# Patient Record
Sex: Female | Born: 1945
Health system: Southern US, Community
[De-identification: ages and names within clinical notes are randomized; demographics above are authoritative.]

## PROBLEM LIST (undated history)

## (undated) DIAGNOSIS — I10 Essential (primary) hypertension: Secondary | ICD-10-CM

## (undated) DIAGNOSIS — E079 Disorder of thyroid, unspecified: Secondary | ICD-10-CM

## (undated) DIAGNOSIS — E785 Hyperlipidemia, unspecified: Secondary | ICD-10-CM

## (undated) DIAGNOSIS — T7840XA Allergy, unspecified, initial encounter: Secondary | ICD-10-CM

## (undated) DIAGNOSIS — F32A Depression, unspecified: Secondary | ICD-10-CM

## (undated) DIAGNOSIS — F329 Major depressive disorder, single episode, unspecified: Secondary | ICD-10-CM

## (undated) DIAGNOSIS — M8580 Other specified disorders of bone density and structure, unspecified site: Secondary | ICD-10-CM

## (undated) DIAGNOSIS — F419 Anxiety disorder, unspecified: Secondary | ICD-10-CM

## (undated) HISTORY — DX: Essential (primary) hypertension: I10

## (undated) HISTORY — PX: ABDOMINAL HYSTERECTOMY: SHX81

## (undated) HISTORY — PX: TOTAL HIP ARTHROPLASTY: SHX124

## (undated) HISTORY — DX: Other specified disorders of bone density and structure, unspecified site: M85.80

## (undated) HISTORY — DX: Hyperlipidemia, unspecified: E78.5

## (undated) HISTORY — PX: THYROID SURGERY: SHX805

## (undated) HISTORY — DX: Depression, unspecified: F32.A

## (undated) HISTORY — DX: Anxiety disorder, unspecified: F41.9

## (undated) HISTORY — DX: Allergy, unspecified, initial encounter: T78.40XA

## (undated) HISTORY — DX: Major depressive disorder, single episode, unspecified: F32.9

## (undated) HISTORY — DX: Disorder of thyroid, unspecified: E07.9

---

## 1997-09-03 ENCOUNTER — Other Ambulatory Visit: Admission: RE | Admit: 1997-09-03 | Discharge: 1997-09-03 | Payer: Self-pay | Admitting: Obstetrics and Gynecology

## 1999-04-22 ENCOUNTER — Other Ambulatory Visit: Admission: RE | Admit: 1999-04-22 | Discharge: 1999-04-22 | Payer: Self-pay | Admitting: Obstetrics and Gynecology

## 2004-08-13 ENCOUNTER — Encounter: Admission: RE | Admit: 2004-08-13 | Discharge: 2004-08-13 | Payer: Self-pay | Admitting: Rheumatology

## 2005-01-26 ENCOUNTER — Inpatient Hospital Stay (HOSPITAL_COMMUNITY): Admission: RE | Admit: 2005-01-26 | Discharge: 2005-01-29 | Payer: Self-pay | Admitting: Orthopedic Surgery

## 2006-10-26 IMAGING — CR DG CHEST 2V
2 series · 2 of 2 positions shown · non-contrast
Comparison: none

CLINICAL DATA: Hypertension, preop

Chest 2 view:
No previous for comparison. Lungs are hyperinflated without focal infiltrate or
overt edema. Heart size and mediastinal contour normal. No effusion. Visualized
bones unremarkable.

[view not recorded (1 of 2)]
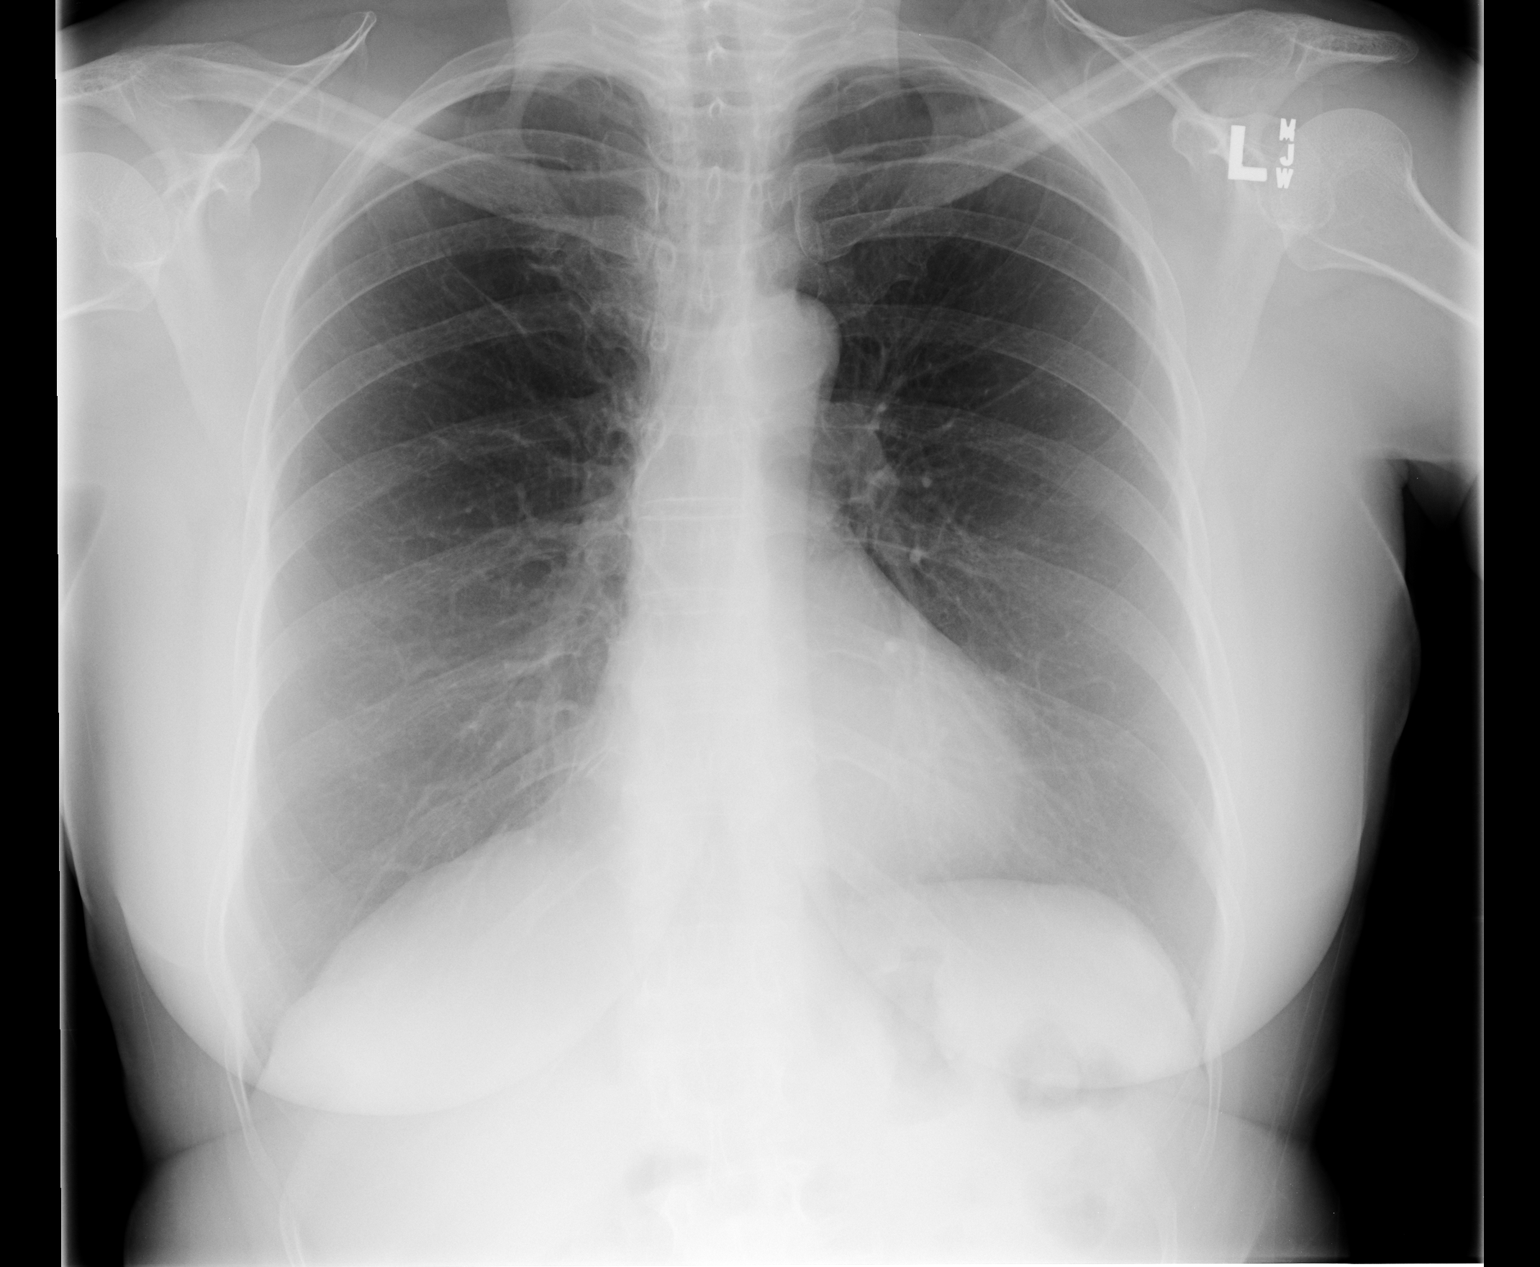

[view not recorded (2 of 2)]
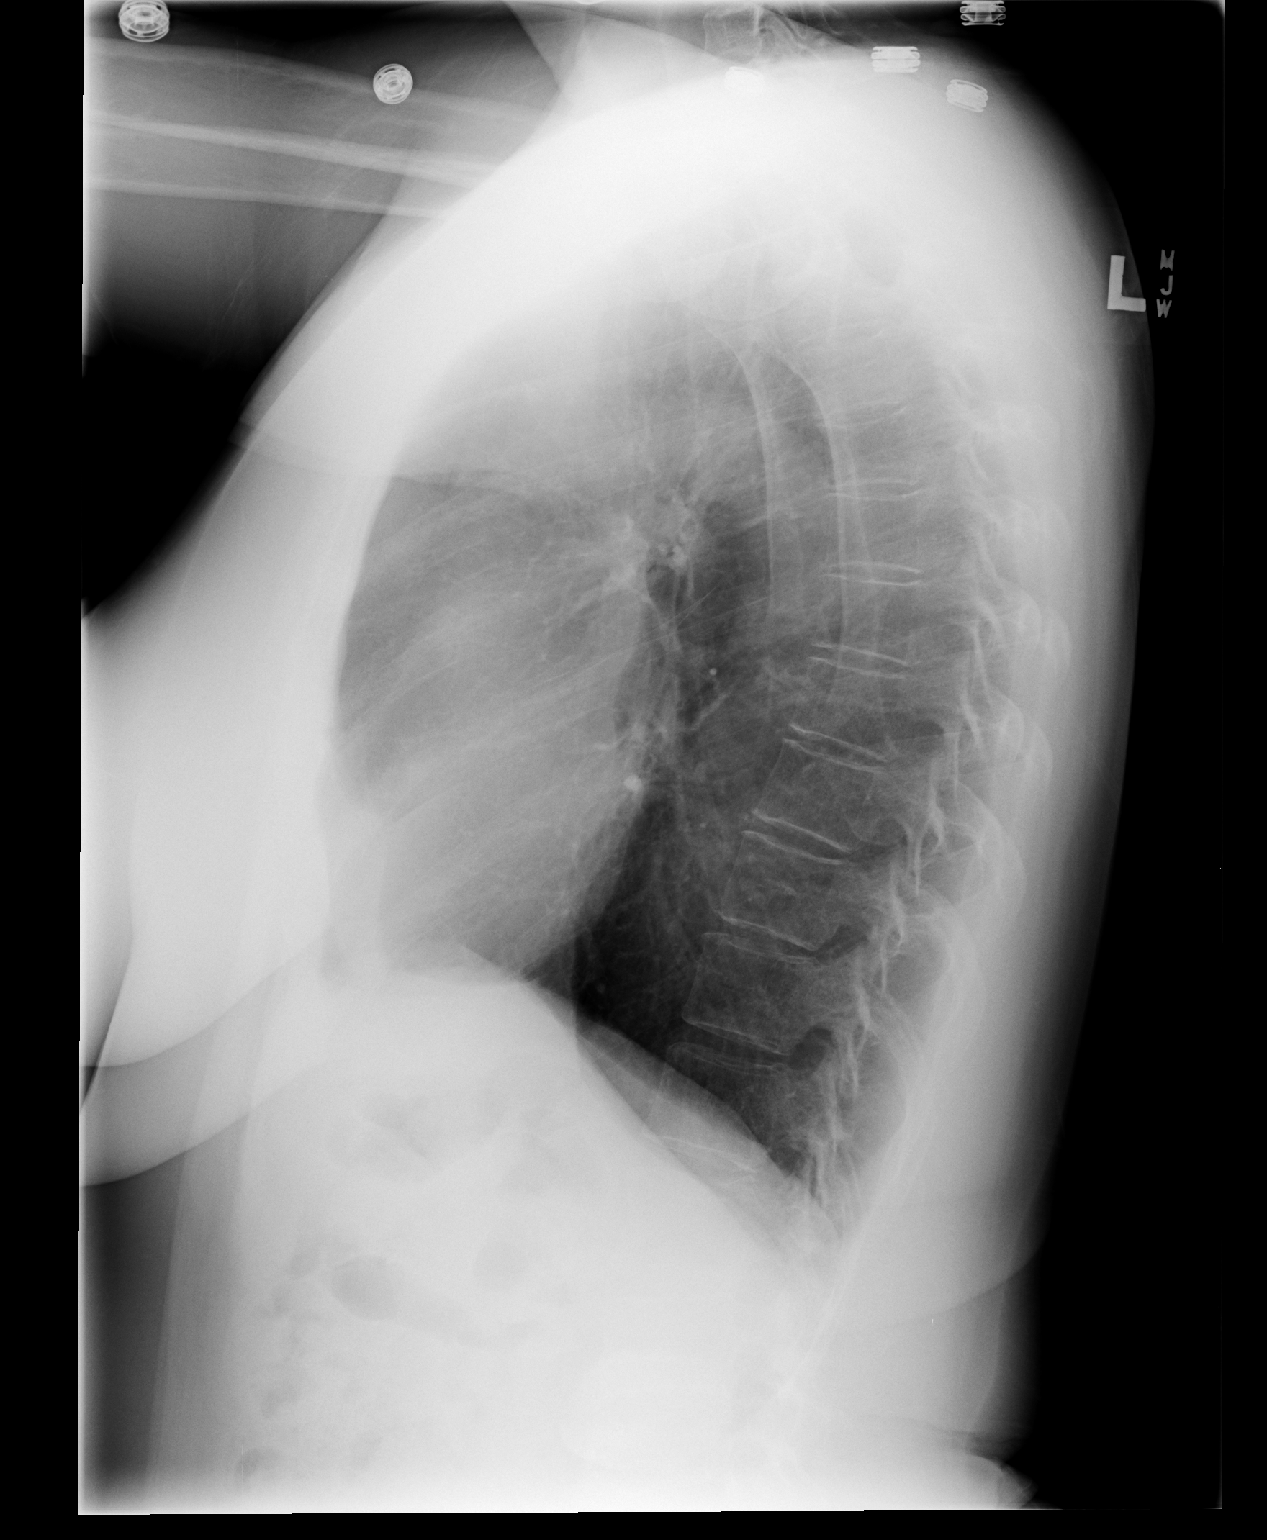

[2 of 2 positions shown; findings below may reference images not displayed]

IMPRESSION: 1. Hyperinflation without acute or superimposed abnormality

## 2007-01-21 ENCOUNTER — Ambulatory Visit: Payer: Self-pay | Admitting: Internal Medicine

## 2007-02-09 ENCOUNTER — Ambulatory Visit: Payer: Self-pay | Admitting: Internal Medicine

## 2010-08-01 NOTE — Discharge Summary (Signed)
Doyle, Casey NO.:  [id number]   MEDICAL RECORD NO.:  [id number]          PATIENT TYPE:  INP   LOCATION:  5017                         FACILITY:  [facility]   PHYSICIAN:  Mila Homer. Sherlean Foot, M.D. DATE OF BIRTH:  1945/11/04   DATE OF ADMISSION:  01/26/2005  DATE OF DISCHARGE:  01/29/2005                                 DISCHARGE SUMMARY   ADMISSION DIAGNOSES:  1.  Right hip osteoarthritis/avascular necrosis.  2.  Hypertension.  3.  Hypothyroidism with history of partial thyroidectomy.  4.  Hay fever.   DISCHARGE DIAGNOSES:  1.  Status post right total hip arthroplasty.  2.  Urinary tract infection preoperatively, treated with Bactrim.  3.  Acute blood loss anemia secondary to surgery requiring no blood      products.  4.  Hypokalemia, treated.  5.  Nausea and hypertension probably secondary to Percocet.  Patient placed      on Vicodin.  6.  History of hypertension.  7.  History of hypothyroidism with history of partial thyroidectomy.  8.  History of hay fever.   HISTORY OF PRESENT ILLNESS:  [name] is a 65 year old black female with a  two-year history of gradual onset of progressively worsening right hip pain.  The patient with a history of a fall in 1999 with a very questionable injury  to the hip but no other injuries or surgery.  Right hip pain is described as  a constant throbbing sensation during the day and just a tightening  sensation at night.  The pain seems to be in the right groin with some  tingling there and with occasional radiation down to the knee.  The pain  increases with activity and decreases with rest.  The patient denies any  back pain or paresthesia associated with the pain.  She does have difficulty  tying shoes.  She takes Aleve for pain, and this provides her with moderate  relief.  The patient denies any cortisone injections.  She does not ambulate  with any assistive devices.   ALLERGIES:  CORTISONE CAUSES RASH.   MEDICATIONS:  1.  Actonel 35 mg p.o. on Sunday.  2.  Synthroid 75 mcg one tablet q.a.m.  3.  Atenolol 25 mg one tablet p.o. q.a.m.  4.  Hydrochlorothiazide 25 mg one tablet p.o. q.a.m.  5.  Trazodone 50 mg p.o. q.h.s. p.r.n. insomnia.  6.  Aleve two tablets p.o. b.i.d. p.r.n.  7.  Calcium two tablets p.o. b.i.d.   PROCEDURE:  The patient was taken to the operating room on January 26, 2005  by Dr. Georgena Spurling, assisted by Richardean Canal, P.A.-C.  The patient was  placed under general anesthesia, and a right total hip arthroplasty was  performed.  The patient tolerated the procedure well and returned to  recovery in good and stable condition.   COMPONENTS USED:  52-mm outside diameter acetabular component with a 32-mm  inner diameter poly liner, a size 14 femoral stem, and a femoral head 32 mm  in diameter plus 1 mm neck length, using ____________ mm neck length.   HOSPITAL  COURSE:  On postoperative day #1, the patient had a T max of 100.1.  Otherwise, vital signs were stable.  H&H was 10.8 and 31.2.  White count was  5300.  UA on admission showed few bacteria, white blood cells 3-6,  leukocytes small.  Treated with Bactrim preoperatively.  The patient on  Ancef postoperatively, awaiting culture and sensitivity.  Otherwise, the  patient with no chest pain, shortness of breath, nausea, or vomiting.  Pain  under adequate control.   On postoperative day #2, the patient complaining of nausea and some  dizziness when first getting up, resolved.  No chest pain, no shortness of  breath.  The patient afebrile, vital signs stable.  H&H 9.3 and 27.4.  White  count 7400.  Potassium was 3.2 and was replaced.  Patient progressing well  with physical therapy and was able to ambulate 8 feet with a rolling walker.  Later on the morning of January 28, 2005, postoperative day #2, the patient  became hypotensive.  Heart rate was within normal limits.  Patient with  nausea.  Percocet was discontinued.   Vicodin was started for pain, and  Zofran was given for nausea.   On postoperative day #3, the patient overall felt much improved.  No chest  pain, no shortness of breath, nausea, vomiting, calf pain.  Pain under  adequate control.  T max 99.0.  Vital signs within normal limits.  H&H 8.6,  and 24.8.  White count was 6400.  Potassium was 3.5.  Urine culture still  pending at that time.   Patient requesting to go home despite some nausea, and medications were  called into [facility] on [address] for nausea post the patient being discharged.   LABORATORY DATA:  CBC dated January 22, 2005, all values within normal  limits.  Coags on admission, all values within normal limits.  Routine  chemistries on admission revealed a sodium of 138, potassium 3.9, chloride  100, bicarb 30, glucose 55 and low, BUN 14, creatinine 1.1, calcium 10.4.  Hepatic enzymes on admission, all values within normal limits.  Urinalysis  dated January 22, 2005 showed moderate leukocyte esterase, white blood cells  were 7-10.  Repeat urinalysis on January 27, 2005 revealed leukocyte  esterase small, bacteria few.  Urine culture and sensitivity on January 29, 2005 showed no growth.   X-rays of right hip dated January 26, 2005 showed the right hip to be  status post hip replacement without complicating features.  EKG dated  January 22, 2005 showed sinus bradycardia with a heart rate of 56 beats per  minute, PR interval 158 milliseconds, P-R-T axis of 6640 and 54.   DISCHARGE MEDICATIONS:  Copy of discharge instructions not available at time  of discharge summary; however, the patient was to continue:  1.  Actonel 35 mg on Sunday.  2.  Synthroid 75 mcg daily.  3.  Atenolol 25 mg daily.  Hold for systolic blood pressure less than 20.  4.  Hydrochlorothiazide 25 mg daily.  5.  Trazodone 50 mg q.h.s.  6.  Aleve p.r.n.  The patient is to hold this medication. 7.  Calcium two tablets two times a day.  8.  The patient was  placed on Lovenox for a total of 14 days.  As an      outpatient, the patient will get 40 mg of Lovenox daily.  This begins on      January 22, 2005.  AST was 23.  First dose would have been  on January 27, 2005, and last day of dose should be February 10, 2005.  9.  The patient was also on Bactrim DS at the time of discharge.  Unsure of      the duration of this.  10. Vicodin 5/500 one to two tablets p.o. q.4-6h. p.r.n. pain.   DIET:  No restrictions.   WOUND CARE:  The patient is to keep wound clean and dry and change dressing  daily.  May shower after two days of no drainage.  The patient is to call  the office for any signs of infection or if pain is not adequately  controlled.   FOLLOW UP:  The patient needs followup with Dr. Sherlean Foot in the office in  approximately 14 days from surgery.  The patient is to call the office at  [phone] for an appointment.   ACTIVITY:  The patient is weightbearing as tolerated on the right leg with a  walker.   DISPOSITION:  The patient was discharged home in good and stable condition.      Richardean Canal, P.A.    ______________________________  Mila Homer. Sherlean Foot, M.D.    GC/MEDQ  D:  04/13/2005  T:  04/14/2005  Job:  [id number]   cc:   Mila Homer. Sherlean Foot, M.D.  Fax: [phone]

## 2010-08-01 NOTE — H&P (Signed)
Casey, Doyle NO.:  [id number]   MEDICAL RECORD NO.:  [id number]          PATIENT TYPE:  INP   LOCATION:  5017                         FACILITY:  [facility]   PHYSICIAN:  Mila Homer. Sherlean Foot, M.D. DATE OF BIRTH:  1945/11/23   DATE OF ADMISSION:  01/26/2005  DATE OF DISCHARGE:                                HISTORY & PHYSICAL   CHIEF COMPLAINT:  Right hip pain for the last 2 years.   HISTORY OF PRESENT ILLNESS:  This 65 year old black female patient presented  to Dr. Sherlean Foot with a 2-year history of gradual onset progressively worsening  right hip pain. She does have a history of a fall in 1999 with a  questionable injury to the hip but she has had no other surgery or injury to  her hip.   At this point the right hip pain is a constant throbbing sensation during  the day and just a nagging sensation at night. Pain seems to be in the right  groin with some tingling there and then also over the right greater  trochanter with occasional radiation down to the knee. Pain increases with  activity and decreases with rest. She denies any back pain or paresthesias  associated with the pain. She does have difficulty tying her shoes and  occasional night pain. She is currently taking two Aleve for pain and that  provides a moderate amount of relief. She has not received any cortisone  injections and does not ambulate with any assistive devices.   ALLERGIES:  CORTISONE causes a rash.   CURRENT MEDICATIONS:  1.  Actonel 35 milligrams p.o. q. Sunday.  2.  Synthroid 75 mcg 1 tablet p.o. q.a.m.  3.  Atenolol 25 milligrams 1 tablet p.o. q.a.m.  4.  Hydrochlorothiazide 25 milligrams 1 tablet p.o. q.a.m.  5.  Trazodone 50 milligrams p.o. q.h.s. p.r.n. insomnia.  6.  Aleve 2 tablets p.o. b.i.d. p.r.n.  7.  Calcium 2 tablets p.o. b.i.d.   PAST MEDICAL HISTORY:  1.  Hypertension 20 years ago diagnosed.  2.  She had abnormal cells in her thyroid and a partial thyroidectomy in    1997.  3.  Hay fever.   She denies any history of diabetes mellitus, hiatal hernia, reflux, peptic  ulcer disease, heart disease, asthma or any other chronic medical condition  other than noted previously.   PAST SURGICAL HISTORY:  1.  Hysterectomy by Dr. Kizzie Bane 1982.  2.  Partial thyroidectomy 1997.   She denies any complications from the above-mentioned procedures.   SOCIAL HISTORY:  She was a social smoker, smoking two cigarettes a day for 2-  3 years but she quit 20 years ago. She does drink alcohol about twice a  week. She does not use any drugs. She is married and has two children, a boy  and girl. She lives with her husband and daughter in a two-story house with  four steps into the main entrance. She is retired from [employer]. Her medical doctor is Dr. Shaune Pollack at [place] at [phone].   FAMILY HISTORY:  Mother died at  age of 38 with rheumatoid arthritis and a  stroke. Father died at age of 18 with heart attack. She has one sister alive  at age 100 with osteoarthritis. Her daughter is 40, son 70 and they are alive  and well.   REVIEW OF SYSTEMS:  She does wear glasses. She has some problems with sinus  congestion in the fall and spring. She does not have a living will and her  power of attorney is her husband Casey Doyle. All other systems are  negative and noncontributory.   PHYSICAL EXAM:  GENERAL:  Well-developed, well-nourished black female in no  acute distress. Talks easily with examiner. Height 5 feet 3-1/4 inches,  weight 142 pounds. Mood and affect are appropriate. Talks easily with  examiner. BMI is 24.  VITAL SIGNS: Temperature 96.6 degrees Fahrenheit, pulse 68, respirations 12,  BP 135/88.  HEENT: Normocephalic, atraumatic without frontal or maxillary sinus  tenderness to palpation. Conjunctivae pink. Sclerae anicteric. PERLA. EOMs  intact. No visible external ear deformities. Hearing grossly intact.  Tympanic membranes pearly gray with  good light reflex. Nose: The nasal  septum midline. Nasal mucosa pink and moist without exudates or polyps  noted. Buccal mucosa pink and moist. Pharynx without erythema or exudates.  Tongue and uvula midline. Tongue without fasciculations and uvula rises  equally with phonation.  NECK: No visible masses or lesions noted. Trachea midline. No palpable  lymphadenopathy or thyromegaly. Carotids +2 bilaterally without bruits. Full  range of motion, nontender to palpation along the cervical spine.  CARDIOVASCULAR:  Heart rate and rhythm regular. S1-S2 present without rubs,  clicks or murmurs noted.  RESPIRATORY: Respirations even and unlabored. Breath sounds clear to  auscultation bilaterally without rales or wheezes noted.  ABDOMEN:  Rounded abdominal contour. Bowel sounds present x4 quadrants.  Soft, nontender to palpation without hepatosplenomegaly or CVA tenderness.  Femoral pulses +2 bilaterally. Nontender to palpation along the vertebral  column.  BREASTS:  GENITOURINARY:  RECTAL:  PELVIC: These exams deferred at this  time.  MUSCULOSKELETAL: No obvious deformities bilateral upper extremities with  full range of motion of these extremities without pain. Radial pulses +2  bilaterally. She has full range of motion of her ankles and toes  bilaterally. DP and PT pulses are +2. No lower extremity edema. No calf pain  with palpation. Negative Homans' sign.   Left hip has full extension and flexion to 110 degrees with full internal  and external rotation, all without pain. Left knee has full extension and  flexion to 130 degrees with some patellofemoral crepitus noted. No pain with  palpation along the joint line. Stable to varus and valgus stress. Negative  anterior drawer. No effusion.   Right hip has full extension but flexion only to 90 degrees and her leg  externally rotates as you bring it up into flexion. She has absolutely no internal-external rotation and when you attempt to do  that, that does cause  pain. No pain with palpation over the greater trochanter. Right knee has  full extension and flexion to 130 degrees without crepitus. No pain with  palpation along the joint line. No effusion. Stable to varus and valgus  stress. Negative anterior drawer.  NEUROLOGIC: Alert and oriented x3. Cranial nerves II-XII are grossly intact.  Strength 5/5 bilateral upper and lower extremities. Rapid alternating  movements intact. Deep tendon reflexes 2+.   RADIOLOGIC FINDINGS:  X-rays taken in May 2006 showed moderate arthritis,  moderate to severe joint space narrowing in the right  hip. There is a very,  osteophyte formation. Left hip is within normal limits.   IMPRESSION:  1.  Avascular necrosis/osteoarthritis, right hip.  2.  Hypertension.  3.  Hypothyroidism with history of partial thyroidectomy.  4.  Hay fever.   PLAN:  [name] will be admitted to [facility] on January 26, 2005 where she will undergo a right total hip arthroplasty by Dr. Mila Homer.  Lucey. She will undergo all the routine preoperative laboratory tests and  studies prior to this procedure. Dr. Kevan Ny has cleared her for surgery. If  we have any medical issues while she is hospitalized, we will consult the  Hospitalists.      Legrand Pitts Duffy, P.A.    ______________________________  Mila Homer. Sherlean Foot, M.D.    KED/MEDQ  D:  01/27/2005  T:  01/27/2005  Job:  [id number]

## 2010-08-01 NOTE — Op Note (Signed)
Casey Doyle, Casey Doyle                ACCOUNT NO.:  [id number]   MEDICAL RECORD NO.:  [id number]          PATIENT TYPE:  INP   LOCATION:  2550                         FACILITY:  [facility]   PHYSICIAN:  Mila Homer. Sherlean Foot, M.D. DATE OF BIRTH:  Nov 09, 1945   DATE OF PROCEDURE:  01/26/2005  DATE OF DISCHARGE:                                 OPERATIVE REPORT   PREOPERATIVE DIAGNOSIS:  Right hip osteoarthritis.   POSTOPERATIVE DIAGNOSIS:  Right hip osteoarthritis.   OPERATION PERFORMED:  Right total hip arthroplasty.   SURGEON:  Mila Homer. Sherlean Foot, M.D.   ASSISTANT:  None.   ANESTHESIA:  General.   INDICATIONS FOR PROCEDURE:  The patient is a 65 year old black female with  failure of conservative measures for avascular necrosis and osteoarthritis  of the right hip.  Informed consent was obtained and medical clearance was  obtained as well.   DESCRIPTION OF PROCEDURE:  The patient was laid supine and administered  general anesthesia and Foley catheter placed and placed in right up, left  down lateral decubitus position.  The hip was prepped and draped in the  usual sterile fashion.  A curvilinear incision was used and centered over  the trochanter, made with a #10 blade.  This was approximately 8 cm in  length.  I then used the cautery to cauterize bleeding vessels, went down to  and through the fascia.  I placed a Charnley retractor in place.  I then  elevated the anterior one third of the gluteus medius and vastus lateralis  in a single sleeve.  I then tagged these structures and then performed an  anterior hip capsulectomy.  Once this was performed, I then used the cutting  guide, marked out the neck cut and made that neck cut with a reciprocating  saw.  I then placed the leg off the sterile pouch with external rotation and  flexion at the knee.  I then placed a Homan anterior and posterior, cleaned  out the labrum and soft tissue in the hip and then reamed sequentially up to  a size 50 mm  reamer.  At this point I tamped in a 52 mm ho hole, no spike  cup.  I then turned my attention to the femur with the leg off into a  sterile pouch, I reamed up to 14, broached to 14, trialed with a 14 with a 0  head.  I had put in already the real standard polyethylene accepting a 32 mm  head.  This afforded excellent stability.  I then removed the trial  components, copiously irrigated and press-fitted the stem in the femur,  snapped on the 32 x +0 head on to a clean Morse taper, located the hip both  through extremes of range of motion and there was no instability.  I then  irrigated and closed the abductor vastus lateralis sleeve through drill  holes.  I then closed the fascia lata with a running #1 Vicryl suture,  closed the deep soft tissue with interrupted 0 Vicryl, subcuticular 2-0  Vicryl and skin staples.   COMPLICATIONS:  None.  DRAINS:  None.   ESTIMATED BLOOD LOSS:  300 mL.           ______________________________  Mila Homer. Sherlean Foot, M.D.     SDL/MEDQ  D:  01/26/2005  T:  01/27/2005  Job:  [id number]

## 2015-04-22 DIAGNOSIS — Z803 Family history of malignant neoplasm of breast: Secondary | ICD-10-CM | POA: Diagnosis not present

## 2015-04-22 DIAGNOSIS — Z1231 Encounter for screening mammogram for malignant neoplasm of breast: Secondary | ICD-10-CM | POA: Diagnosis not present

## 2015-04-23 DIAGNOSIS — J301 Allergic rhinitis due to pollen: Secondary | ICD-10-CM | POA: Diagnosis not present

## 2015-04-23 DIAGNOSIS — J3089 Other allergic rhinitis: Secondary | ICD-10-CM | POA: Diagnosis not present

## 2015-05-02 DIAGNOSIS — J3089 Other allergic rhinitis: Secondary | ICD-10-CM | POA: Diagnosis not present

## 2015-05-02 DIAGNOSIS — J301 Allergic rhinitis due to pollen: Secondary | ICD-10-CM | POA: Diagnosis not present

## 2015-05-10 DIAGNOSIS — J3089 Other allergic rhinitis: Secondary | ICD-10-CM | POA: Diagnosis not present

## 2015-05-10 DIAGNOSIS — J301 Allergic rhinitis due to pollen: Secondary | ICD-10-CM | POA: Diagnosis not present

## 2015-05-22 DIAGNOSIS — J3089 Other allergic rhinitis: Secondary | ICD-10-CM | POA: Diagnosis not present

## 2015-05-22 DIAGNOSIS — J301 Allergic rhinitis due to pollen: Secondary | ICD-10-CM | POA: Diagnosis not present

## 2015-05-31 DIAGNOSIS — J301 Allergic rhinitis due to pollen: Secondary | ICD-10-CM | POA: Diagnosis not present

## 2015-05-31 DIAGNOSIS — J3089 Other allergic rhinitis: Secondary | ICD-10-CM | POA: Diagnosis not present

## 2015-06-07 DIAGNOSIS — J3089 Other allergic rhinitis: Secondary | ICD-10-CM | POA: Diagnosis not present

## 2015-06-07 DIAGNOSIS — J301 Allergic rhinitis due to pollen: Secondary | ICD-10-CM | POA: Diagnosis not present

## 2015-06-17 DIAGNOSIS — J301 Allergic rhinitis due to pollen: Secondary | ICD-10-CM | POA: Diagnosis not present

## 2015-06-17 DIAGNOSIS — J3089 Other allergic rhinitis: Secondary | ICD-10-CM | POA: Diagnosis not present

## 2015-06-26 DIAGNOSIS — J3089 Other allergic rhinitis: Secondary | ICD-10-CM | POA: Diagnosis not present

## 2015-06-26 DIAGNOSIS — J301 Allergic rhinitis due to pollen: Secondary | ICD-10-CM | POA: Diagnosis not present

## 2015-07-10 DIAGNOSIS — J301 Allergic rhinitis due to pollen: Secondary | ICD-10-CM | POA: Diagnosis not present

## 2015-07-10 DIAGNOSIS — J3089 Other allergic rhinitis: Secondary | ICD-10-CM | POA: Diagnosis not present

## 2015-07-18 DIAGNOSIS — J301 Allergic rhinitis due to pollen: Secondary | ICD-10-CM | POA: Diagnosis not present

## 2015-07-18 DIAGNOSIS — J3089 Other allergic rhinitis: Secondary | ICD-10-CM | POA: Diagnosis not present

## 2015-07-31 DIAGNOSIS — J301 Allergic rhinitis due to pollen: Secondary | ICD-10-CM | POA: Diagnosis not present

## 2015-07-31 DIAGNOSIS — J3089 Other allergic rhinitis: Secondary | ICD-10-CM | POA: Diagnosis not present

## 2015-08-08 DIAGNOSIS — J3089 Other allergic rhinitis: Secondary | ICD-10-CM | POA: Diagnosis not present

## 2015-08-08 DIAGNOSIS — J301 Allergic rhinitis due to pollen: Secondary | ICD-10-CM | POA: Diagnosis not present

## 2015-08-15 DIAGNOSIS — J3089 Other allergic rhinitis: Secondary | ICD-10-CM | POA: Diagnosis not present

## 2015-08-15 DIAGNOSIS — G4700 Insomnia, unspecified: Secondary | ICD-10-CM | POA: Diagnosis not present

## 2015-08-15 DIAGNOSIS — E039 Hypothyroidism, unspecified: Secondary | ICD-10-CM | POA: Diagnosis not present

## 2015-08-15 DIAGNOSIS — I10 Essential (primary) hypertension: Secondary | ICD-10-CM | POA: Diagnosis not present

## 2015-08-15 DIAGNOSIS — E559 Vitamin D deficiency, unspecified: Secondary | ICD-10-CM | POA: Diagnosis not present

## 2015-08-15 DIAGNOSIS — J301 Allergic rhinitis due to pollen: Secondary | ICD-10-CM | POA: Diagnosis not present

## 2015-08-15 DIAGNOSIS — E7800 Pure hypercholesterolemia, unspecified: Secondary | ICD-10-CM | POA: Diagnosis not present

## 2015-08-23 DIAGNOSIS — J301 Allergic rhinitis due to pollen: Secondary | ICD-10-CM | POA: Diagnosis not present

## 2015-08-23 DIAGNOSIS — J3089 Other allergic rhinitis: Secondary | ICD-10-CM | POA: Diagnosis not present

## 2015-08-27 DIAGNOSIS — J301 Allergic rhinitis due to pollen: Secondary | ICD-10-CM | POA: Diagnosis not present

## 2015-08-27 DIAGNOSIS — J3089 Other allergic rhinitis: Secondary | ICD-10-CM | POA: Diagnosis not present

## 2015-09-10 DIAGNOSIS — J301 Allergic rhinitis due to pollen: Secondary | ICD-10-CM | POA: Diagnosis not present

## 2015-09-10 DIAGNOSIS — J3089 Other allergic rhinitis: Secondary | ICD-10-CM | POA: Diagnosis not present

## 2015-09-19 DIAGNOSIS — J301 Allergic rhinitis due to pollen: Secondary | ICD-10-CM | POA: Diagnosis not present

## 2015-09-19 DIAGNOSIS — J3089 Other allergic rhinitis: Secondary | ICD-10-CM | POA: Diagnosis not present

## 2015-09-27 DIAGNOSIS — J3089 Other allergic rhinitis: Secondary | ICD-10-CM | POA: Diagnosis not present

## 2015-09-27 DIAGNOSIS — J301 Allergic rhinitis due to pollen: Secondary | ICD-10-CM | POA: Diagnosis not present

## 2015-10-11 DIAGNOSIS — J301 Allergic rhinitis due to pollen: Secondary | ICD-10-CM | POA: Diagnosis not present

## 2015-10-11 DIAGNOSIS — J3089 Other allergic rhinitis: Secondary | ICD-10-CM | POA: Diagnosis not present

## 2015-10-25 DIAGNOSIS — J3089 Other allergic rhinitis: Secondary | ICD-10-CM | POA: Diagnosis not present

## 2015-10-25 DIAGNOSIS — J301 Allergic rhinitis due to pollen: Secondary | ICD-10-CM | POA: Diagnosis not present

## 2015-10-30 DIAGNOSIS — J301 Allergic rhinitis due to pollen: Secondary | ICD-10-CM | POA: Diagnosis not present

## 2015-10-30 DIAGNOSIS — J3089 Other allergic rhinitis: Secondary | ICD-10-CM | POA: Diagnosis not present

## 2015-11-08 DIAGNOSIS — J3089 Other allergic rhinitis: Secondary | ICD-10-CM | POA: Diagnosis not present

## 2015-11-08 DIAGNOSIS — J301 Allergic rhinitis due to pollen: Secondary | ICD-10-CM | POA: Diagnosis not present

## 2015-11-15 DIAGNOSIS — J3089 Other allergic rhinitis: Secondary | ICD-10-CM | POA: Diagnosis not present

## 2015-11-15 DIAGNOSIS — J301 Allergic rhinitis due to pollen: Secondary | ICD-10-CM | POA: Diagnosis not present

## 2015-11-20 DIAGNOSIS — J3089 Other allergic rhinitis: Secondary | ICD-10-CM | POA: Diagnosis not present

## 2015-11-20 DIAGNOSIS — J301 Allergic rhinitis due to pollen: Secondary | ICD-10-CM | POA: Diagnosis not present

## 2015-11-29 DIAGNOSIS — J301 Allergic rhinitis due to pollen: Secondary | ICD-10-CM | POA: Diagnosis not present

## 2015-11-29 DIAGNOSIS — J3089 Other allergic rhinitis: Secondary | ICD-10-CM | POA: Diagnosis not present

## 2015-12-04 DIAGNOSIS — J301 Allergic rhinitis due to pollen: Secondary | ICD-10-CM | POA: Diagnosis not present

## 2015-12-04 DIAGNOSIS — J3089 Other allergic rhinitis: Secondary | ICD-10-CM | POA: Diagnosis not present

## 2015-12-09 DIAGNOSIS — J301 Allergic rhinitis due to pollen: Secondary | ICD-10-CM | POA: Diagnosis not present

## 2015-12-09 DIAGNOSIS — J3089 Other allergic rhinitis: Secondary | ICD-10-CM | POA: Diagnosis not present

## 2015-12-19 DIAGNOSIS — J3089 Other allergic rhinitis: Secondary | ICD-10-CM | POA: Diagnosis not present

## 2015-12-19 DIAGNOSIS — J301 Allergic rhinitis due to pollen: Secondary | ICD-10-CM | POA: Diagnosis not present

## 2015-12-27 DIAGNOSIS — J301 Allergic rhinitis due to pollen: Secondary | ICD-10-CM | POA: Diagnosis not present

## 2015-12-27 DIAGNOSIS — J3089 Other allergic rhinitis: Secondary | ICD-10-CM | POA: Diagnosis not present

## 2016-01-10 DIAGNOSIS — J3089 Other allergic rhinitis: Secondary | ICD-10-CM | POA: Diagnosis not present

## 2016-01-10 DIAGNOSIS — J301 Allergic rhinitis due to pollen: Secondary | ICD-10-CM | POA: Diagnosis not present

## 2016-01-17 DIAGNOSIS — J3089 Other allergic rhinitis: Secondary | ICD-10-CM | POA: Diagnosis not present

## 2016-01-17 DIAGNOSIS — J301 Allergic rhinitis due to pollen: Secondary | ICD-10-CM | POA: Diagnosis not present

## 2016-01-23 DIAGNOSIS — J301 Allergic rhinitis due to pollen: Secondary | ICD-10-CM | POA: Diagnosis not present

## 2016-01-23 DIAGNOSIS — J3089 Other allergic rhinitis: Secondary | ICD-10-CM | POA: Diagnosis not present

## 2016-01-31 DIAGNOSIS — J301 Allergic rhinitis due to pollen: Secondary | ICD-10-CM | POA: Diagnosis not present

## 2016-01-31 DIAGNOSIS — J3089 Other allergic rhinitis: Secondary | ICD-10-CM | POA: Diagnosis not present

## 2016-02-12 DIAGNOSIS — E039 Hypothyroidism, unspecified: Secondary | ICD-10-CM | POA: Diagnosis not present

## 2016-02-12 DIAGNOSIS — E559 Vitamin D deficiency, unspecified: Secondary | ICD-10-CM | POA: Diagnosis not present

## 2016-02-12 DIAGNOSIS — M8580 Other specified disorders of bone density and structure, unspecified site: Secondary | ICD-10-CM | POA: Diagnosis not present

## 2016-02-12 DIAGNOSIS — I10 Essential (primary) hypertension: Secondary | ICD-10-CM | POA: Diagnosis not present

## 2016-02-12 DIAGNOSIS — E7800 Pure hypercholesterolemia, unspecified: Secondary | ICD-10-CM | POA: Diagnosis not present

## 2016-02-12 DIAGNOSIS — Z0000 Encounter for general adult medical examination without abnormal findings: Secondary | ICD-10-CM | POA: Diagnosis not present

## 2016-02-12 DIAGNOSIS — F411 Generalized anxiety disorder: Secondary | ICD-10-CM | POA: Diagnosis not present

## 2016-02-20 DIAGNOSIS — J3089 Other allergic rhinitis: Secondary | ICD-10-CM | POA: Diagnosis not present

## 2016-02-20 DIAGNOSIS — J301 Allergic rhinitis due to pollen: Secondary | ICD-10-CM | POA: Diagnosis not present

## 2016-02-28 DIAGNOSIS — J3089 Other allergic rhinitis: Secondary | ICD-10-CM | POA: Diagnosis not present

## 2016-02-28 DIAGNOSIS — J301 Allergic rhinitis due to pollen: Secondary | ICD-10-CM | POA: Diagnosis not present

## 2016-03-05 DIAGNOSIS — J3089 Other allergic rhinitis: Secondary | ICD-10-CM | POA: Diagnosis not present

## 2016-03-05 DIAGNOSIS — J301 Allergic rhinitis due to pollen: Secondary | ICD-10-CM | POA: Diagnosis not present

## 2016-03-12 DIAGNOSIS — G4700 Insomnia, unspecified: Secondary | ICD-10-CM | POA: Diagnosis not present

## 2016-03-12 DIAGNOSIS — F411 Generalized anxiety disorder: Secondary | ICD-10-CM | POA: Diagnosis not present

## 2016-03-13 DIAGNOSIS — Z23 Encounter for immunization: Secondary | ICD-10-CM | POA: Diagnosis not present

## 2016-03-20 DIAGNOSIS — J301 Allergic rhinitis due to pollen: Secondary | ICD-10-CM | POA: Diagnosis not present

## 2016-03-20 DIAGNOSIS — J3089 Other allergic rhinitis: Secondary | ICD-10-CM | POA: Diagnosis not present

## 2016-03-27 DIAGNOSIS — J301 Allergic rhinitis due to pollen: Secondary | ICD-10-CM | POA: Diagnosis not present

## 2016-03-27 DIAGNOSIS — J3089 Other allergic rhinitis: Secondary | ICD-10-CM | POA: Diagnosis not present

## 2016-04-09 DIAGNOSIS — D485 Neoplasm of uncertain behavior of skin: Secondary | ICD-10-CM | POA: Diagnosis not present

## 2016-04-09 DIAGNOSIS — J301 Allergic rhinitis due to pollen: Secondary | ICD-10-CM | POA: Diagnosis not present

## 2016-04-09 DIAGNOSIS — J3089 Other allergic rhinitis: Secondary | ICD-10-CM | POA: Diagnosis not present

## 2016-04-17 DIAGNOSIS — J301 Allergic rhinitis due to pollen: Secondary | ICD-10-CM | POA: Diagnosis not present

## 2016-04-17 DIAGNOSIS — J3089 Other allergic rhinitis: Secondary | ICD-10-CM | POA: Diagnosis not present

## 2016-04-22 DIAGNOSIS — Z803 Family history of malignant neoplasm of breast: Secondary | ICD-10-CM | POA: Diagnosis not present

## 2016-04-22 DIAGNOSIS — Z1231 Encounter for screening mammogram for malignant neoplasm of breast: Secondary | ICD-10-CM | POA: Diagnosis not present

## 2016-04-22 DIAGNOSIS — M8589 Other specified disorders of bone density and structure, multiple sites: Secondary | ICD-10-CM | POA: Diagnosis not present

## 2016-04-29 DIAGNOSIS — F411 Generalized anxiety disorder: Secondary | ICD-10-CM | POA: Diagnosis not present

## 2016-05-07 DIAGNOSIS — J3089 Other allergic rhinitis: Secondary | ICD-10-CM | POA: Diagnosis not present

## 2016-05-07 DIAGNOSIS — J301 Allergic rhinitis due to pollen: Secondary | ICD-10-CM | POA: Diagnosis not present

## 2016-05-21 DIAGNOSIS — J301 Allergic rhinitis due to pollen: Secondary | ICD-10-CM | POA: Diagnosis not present

## 2016-05-21 DIAGNOSIS — J3089 Other allergic rhinitis: Secondary | ICD-10-CM | POA: Diagnosis not present

## 2016-05-29 DIAGNOSIS — J3089 Other allergic rhinitis: Secondary | ICD-10-CM | POA: Diagnosis not present

## 2016-05-29 DIAGNOSIS — J301 Allergic rhinitis due to pollen: Secondary | ICD-10-CM | POA: Diagnosis not present

## 2016-06-10 DIAGNOSIS — J301 Allergic rhinitis due to pollen: Secondary | ICD-10-CM | POA: Diagnosis not present

## 2016-06-10 DIAGNOSIS — J3089 Other allergic rhinitis: Secondary | ICD-10-CM | POA: Diagnosis not present

## 2016-07-02 DIAGNOSIS — J3089 Other allergic rhinitis: Secondary | ICD-10-CM | POA: Diagnosis not present

## 2016-07-02 DIAGNOSIS — J301 Allergic rhinitis due to pollen: Secondary | ICD-10-CM | POA: Diagnosis not present

## 2016-07-08 DIAGNOSIS — J301 Allergic rhinitis due to pollen: Secondary | ICD-10-CM | POA: Diagnosis not present

## 2016-07-08 DIAGNOSIS — J3089 Other allergic rhinitis: Secondary | ICD-10-CM | POA: Diagnosis not present

## 2016-07-16 DIAGNOSIS — J301 Allergic rhinitis due to pollen: Secondary | ICD-10-CM | POA: Diagnosis not present

## 2016-07-16 DIAGNOSIS — J3089 Other allergic rhinitis: Secondary | ICD-10-CM | POA: Diagnosis not present

## 2016-07-23 DIAGNOSIS — J3089 Other allergic rhinitis: Secondary | ICD-10-CM | POA: Diagnosis not present

## 2016-07-23 DIAGNOSIS — J301 Allergic rhinitis due to pollen: Secondary | ICD-10-CM | POA: Diagnosis not present

## 2016-07-30 DIAGNOSIS — J301 Allergic rhinitis due to pollen: Secondary | ICD-10-CM | POA: Diagnosis not present

## 2016-07-30 DIAGNOSIS — J3089 Other allergic rhinitis: Secondary | ICD-10-CM | POA: Diagnosis not present

## 2016-08-14 DIAGNOSIS — J301 Allergic rhinitis due to pollen: Secondary | ICD-10-CM | POA: Diagnosis not present

## 2016-08-14 DIAGNOSIS — J3089 Other allergic rhinitis: Secondary | ICD-10-CM | POA: Diagnosis not present

## 2016-08-19 DIAGNOSIS — J301 Allergic rhinitis due to pollen: Secondary | ICD-10-CM | POA: Diagnosis not present

## 2016-08-19 DIAGNOSIS — J3089 Other allergic rhinitis: Secondary | ICD-10-CM | POA: Diagnosis not present

## 2016-09-02 DIAGNOSIS — J301 Allergic rhinitis due to pollen: Secondary | ICD-10-CM | POA: Diagnosis not present

## 2016-09-02 DIAGNOSIS — J3089 Other allergic rhinitis: Secondary | ICD-10-CM | POA: Diagnosis not present

## 2016-09-09 DIAGNOSIS — E7800 Pure hypercholesterolemia, unspecified: Secondary | ICD-10-CM | POA: Diagnosis not present

## 2016-09-09 DIAGNOSIS — I10 Essential (primary) hypertension: Secondary | ICD-10-CM | POA: Diagnosis not present

## 2016-09-09 DIAGNOSIS — E039 Hypothyroidism, unspecified: Secondary | ICD-10-CM | POA: Diagnosis not present

## 2016-09-09 DIAGNOSIS — E559 Vitamin D deficiency, unspecified: Secondary | ICD-10-CM | POA: Diagnosis not present

## 2016-09-11 DIAGNOSIS — J3089 Other allergic rhinitis: Secondary | ICD-10-CM | POA: Diagnosis not present

## 2016-09-11 DIAGNOSIS — J301 Allergic rhinitis due to pollen: Secondary | ICD-10-CM | POA: Diagnosis not present

## 2016-09-18 DIAGNOSIS — J301 Allergic rhinitis due to pollen: Secondary | ICD-10-CM | POA: Diagnosis not present

## 2016-09-18 DIAGNOSIS — J3089 Other allergic rhinitis: Secondary | ICD-10-CM | POA: Diagnosis not present

## 2016-10-02 DIAGNOSIS — J3089 Other allergic rhinitis: Secondary | ICD-10-CM | POA: Diagnosis not present

## 2016-10-02 DIAGNOSIS — J301 Allergic rhinitis due to pollen: Secondary | ICD-10-CM | POA: Diagnosis not present

## 2016-10-09 DIAGNOSIS — J3089 Other allergic rhinitis: Secondary | ICD-10-CM | POA: Diagnosis not present

## 2016-10-09 DIAGNOSIS — J301 Allergic rhinitis due to pollen: Secondary | ICD-10-CM | POA: Diagnosis not present

## 2016-10-23 DIAGNOSIS — J3089 Other allergic rhinitis: Secondary | ICD-10-CM | POA: Diagnosis not present

## 2016-10-23 DIAGNOSIS — J301 Allergic rhinitis due to pollen: Secondary | ICD-10-CM | POA: Diagnosis not present

## 2016-10-26 DIAGNOSIS — J301 Allergic rhinitis due to pollen: Secondary | ICD-10-CM | POA: Diagnosis not present

## 2016-10-26 DIAGNOSIS — J3089 Other allergic rhinitis: Secondary | ICD-10-CM | POA: Diagnosis not present

## 2016-11-04 DIAGNOSIS — J301 Allergic rhinitis due to pollen: Secondary | ICD-10-CM | POA: Diagnosis not present

## 2016-11-04 DIAGNOSIS — J3089 Other allergic rhinitis: Secondary | ICD-10-CM | POA: Diagnosis not present

## 2016-11-23 DIAGNOSIS — J301 Allergic rhinitis due to pollen: Secondary | ICD-10-CM | POA: Diagnosis not present

## 2016-11-23 DIAGNOSIS — J3089 Other allergic rhinitis: Secondary | ICD-10-CM | POA: Diagnosis not present

## 2016-12-17 DIAGNOSIS — J301 Allergic rhinitis due to pollen: Secondary | ICD-10-CM | POA: Diagnosis not present

## 2016-12-17 DIAGNOSIS — J3089 Other allergic rhinitis: Secondary | ICD-10-CM | POA: Diagnosis not present

## 2017-01-06 DIAGNOSIS — J301 Allergic rhinitis due to pollen: Secondary | ICD-10-CM | POA: Diagnosis not present

## 2017-01-06 DIAGNOSIS — J3089 Other allergic rhinitis: Secondary | ICD-10-CM | POA: Diagnosis not present

## 2017-01-21 DIAGNOSIS — J301 Allergic rhinitis due to pollen: Secondary | ICD-10-CM | POA: Diagnosis not present

## 2017-01-21 DIAGNOSIS — J3089 Other allergic rhinitis: Secondary | ICD-10-CM | POA: Diagnosis not present

## 2017-01-23 DIAGNOSIS — Z23 Encounter for immunization: Secondary | ICD-10-CM | POA: Diagnosis not present

## 2017-01-29 DIAGNOSIS — J3089 Other allergic rhinitis: Secondary | ICD-10-CM | POA: Diagnosis not present

## 2017-01-29 DIAGNOSIS — J301 Allergic rhinitis due to pollen: Secondary | ICD-10-CM | POA: Diagnosis not present

## 2017-02-03 DIAGNOSIS — J3089 Other allergic rhinitis: Secondary | ICD-10-CM | POA: Diagnosis not present

## 2017-02-03 DIAGNOSIS — J301 Allergic rhinitis due to pollen: Secondary | ICD-10-CM | POA: Diagnosis not present

## 2017-02-12 DIAGNOSIS — J301 Allergic rhinitis due to pollen: Secondary | ICD-10-CM | POA: Diagnosis not present

## 2017-02-12 DIAGNOSIS — J3089 Other allergic rhinitis: Secondary | ICD-10-CM | POA: Diagnosis not present

## 2017-02-18 DIAGNOSIS — J3089 Other allergic rhinitis: Secondary | ICD-10-CM | POA: Diagnosis not present

## 2017-02-18 DIAGNOSIS — J301 Allergic rhinitis due to pollen: Secondary | ICD-10-CM | POA: Diagnosis not present

## 2017-02-25 DIAGNOSIS — E559 Vitamin D deficiency, unspecified: Secondary | ICD-10-CM | POA: Diagnosis not present

## 2017-02-25 DIAGNOSIS — I10 Essential (primary) hypertension: Secondary | ICD-10-CM | POA: Diagnosis not present

## 2017-02-25 DIAGNOSIS — Z1159 Encounter for screening for other viral diseases: Secondary | ICD-10-CM | POA: Diagnosis not present

## 2017-02-25 DIAGNOSIS — E039 Hypothyroidism, unspecified: Secondary | ICD-10-CM | POA: Diagnosis not present

## 2017-02-25 DIAGNOSIS — Z0000 Encounter for general adult medical examination without abnormal findings: Secondary | ICD-10-CM | POA: Diagnosis not present

## 2017-03-05 DIAGNOSIS — J3089 Other allergic rhinitis: Secondary | ICD-10-CM | POA: Diagnosis not present

## 2017-03-05 DIAGNOSIS — J301 Allergic rhinitis due to pollen: Secondary | ICD-10-CM | POA: Diagnosis not present

## 2017-03-19 ENCOUNTER — Encounter: Payer: Self-pay | Admitting: Internal Medicine

## 2017-03-24 DIAGNOSIS — J301 Allergic rhinitis due to pollen: Secondary | ICD-10-CM | POA: Diagnosis not present

## 2017-03-24 DIAGNOSIS — J3089 Other allergic rhinitis: Secondary | ICD-10-CM | POA: Diagnosis not present

## 2017-04-02 DIAGNOSIS — J301 Allergic rhinitis due to pollen: Secondary | ICD-10-CM | POA: Diagnosis not present

## 2017-04-02 DIAGNOSIS — J3089 Other allergic rhinitis: Secondary | ICD-10-CM | POA: Diagnosis not present

## 2017-04-09 DIAGNOSIS — J3089 Other allergic rhinitis: Secondary | ICD-10-CM | POA: Diagnosis not present

## 2017-04-09 DIAGNOSIS — J301 Allergic rhinitis due to pollen: Secondary | ICD-10-CM | POA: Diagnosis not present

## 2017-04-21 DIAGNOSIS — J301 Allergic rhinitis due to pollen: Secondary | ICD-10-CM | POA: Diagnosis not present

## 2017-04-21 DIAGNOSIS — J3089 Other allergic rhinitis: Secondary | ICD-10-CM | POA: Diagnosis not present

## 2017-04-30 DIAGNOSIS — J301 Allergic rhinitis due to pollen: Secondary | ICD-10-CM | POA: Diagnosis not present

## 2017-04-30 DIAGNOSIS — J3089 Other allergic rhinitis: Secondary | ICD-10-CM | POA: Diagnosis not present

## 2017-05-12 DIAGNOSIS — J3089 Other allergic rhinitis: Secondary | ICD-10-CM | POA: Diagnosis not present

## 2017-05-12 DIAGNOSIS — J301 Allergic rhinitis due to pollen: Secondary | ICD-10-CM | POA: Diagnosis not present

## 2017-05-28 DIAGNOSIS — J3089 Other allergic rhinitis: Secondary | ICD-10-CM | POA: Diagnosis not present

## 2017-05-28 DIAGNOSIS — J301 Allergic rhinitis due to pollen: Secondary | ICD-10-CM | POA: Diagnosis not present

## 2017-06-04 DIAGNOSIS — J301 Allergic rhinitis due to pollen: Secondary | ICD-10-CM | POA: Diagnosis not present

## 2017-06-04 DIAGNOSIS — J3089 Other allergic rhinitis: Secondary | ICD-10-CM | POA: Diagnosis not present

## 2017-07-01 DIAGNOSIS — J3089 Other allergic rhinitis: Secondary | ICD-10-CM | POA: Diagnosis not present

## 2017-07-01 DIAGNOSIS — J301 Allergic rhinitis due to pollen: Secondary | ICD-10-CM | POA: Diagnosis not present

## 2017-07-09 DIAGNOSIS — R040 Epistaxis: Secondary | ICD-10-CM | POA: Diagnosis not present

## 2017-07-09 DIAGNOSIS — J301 Allergic rhinitis due to pollen: Secondary | ICD-10-CM | POA: Diagnosis not present

## 2017-07-09 DIAGNOSIS — J3089 Other allergic rhinitis: Secondary | ICD-10-CM | POA: Diagnosis not present

## 2017-07-30 DIAGNOSIS — J3089 Other allergic rhinitis: Secondary | ICD-10-CM | POA: Diagnosis not present

## 2017-07-30 DIAGNOSIS — J301 Allergic rhinitis due to pollen: Secondary | ICD-10-CM | POA: Diagnosis not present

## 2017-08-27 DIAGNOSIS — J3089 Other allergic rhinitis: Secondary | ICD-10-CM | POA: Diagnosis not present

## 2017-08-27 DIAGNOSIS — J301 Allergic rhinitis due to pollen: Secondary | ICD-10-CM | POA: Diagnosis not present

## 2017-09-02 DIAGNOSIS — I10 Essential (primary) hypertension: Secondary | ICD-10-CM | POA: Diagnosis not present

## 2017-09-02 DIAGNOSIS — F411 Generalized anxiety disorder: Secondary | ICD-10-CM | POA: Diagnosis not present

## 2017-09-02 DIAGNOSIS — E559 Vitamin D deficiency, unspecified: Secondary | ICD-10-CM | POA: Diagnosis not present

## 2017-09-02 DIAGNOSIS — E7800 Pure hypercholesterolemia, unspecified: Secondary | ICD-10-CM | POA: Diagnosis not present

## 2017-10-01 DIAGNOSIS — J301 Allergic rhinitis due to pollen: Secondary | ICD-10-CM | POA: Diagnosis not present

## 2017-10-01 DIAGNOSIS — J3089 Other allergic rhinitis: Secondary | ICD-10-CM | POA: Diagnosis not present

## 2017-10-29 DIAGNOSIS — J301 Allergic rhinitis due to pollen: Secondary | ICD-10-CM | POA: Diagnosis not present

## 2017-10-29 DIAGNOSIS — J3089 Other allergic rhinitis: Secondary | ICD-10-CM | POA: Diagnosis not present

## 2017-11-04 DIAGNOSIS — J3089 Other allergic rhinitis: Secondary | ICD-10-CM | POA: Diagnosis not present

## 2017-11-04 DIAGNOSIS — J301 Allergic rhinitis due to pollen: Secondary | ICD-10-CM | POA: Diagnosis not present

## 2017-11-24 DIAGNOSIS — J301 Allergic rhinitis due to pollen: Secondary | ICD-10-CM | POA: Diagnosis not present

## 2017-11-24 DIAGNOSIS — J3089 Other allergic rhinitis: Secondary | ICD-10-CM | POA: Diagnosis not present

## 2017-12-30 ENCOUNTER — Encounter: Payer: Self-pay | Admitting: Gastroenterology

## 2018-01-27 ENCOUNTER — Ambulatory Visit (AMBULATORY_SURGERY_CENTER): Payer: Self-pay

## 2018-01-27 ENCOUNTER — Encounter: Payer: Self-pay | Admitting: Gastroenterology

## 2018-01-27 VITALS — Ht 62.0 in | Wt 135.4 lb

## 2018-01-27 DIAGNOSIS — Z1211 Encounter for screening for malignant neoplasm of colon: Secondary | ICD-10-CM

## 2018-01-27 NOTE — Progress Notes (Signed)
Denies allergies to eggs or soy products. Denies complication of anesthesia or sedation. Denies use of weight loss medication. Denies use of O2.   Emmi instructions declined.  

## 2018-02-03 ENCOUNTER — Encounter: Payer: Self-pay | Admitting: Gastroenterology

## 2018-02-03 ENCOUNTER — Ambulatory Visit (AMBULATORY_SURGERY_CENTER): Payer: Medicare Other | Admitting: Gastroenterology

## 2018-02-03 VITALS — BP 99/72 | HR 63 | Temp 96.9°F | Resp 12

## 2018-02-03 DIAGNOSIS — Z86010 Personal history of colonic polyps: Secondary | ICD-10-CM | POA: Diagnosis not present

## 2018-02-03 DIAGNOSIS — Z1211 Encounter for screening for malignant neoplasm of colon: Secondary | ICD-10-CM

## 2018-02-03 DIAGNOSIS — I10 Essential (primary) hypertension: Secondary | ICD-10-CM | POA: Diagnosis not present

## 2018-02-03 MED ORDER — SODIUM CHLORIDE 0.9 % IV SOLN: 500.0000 mL | Freq: Once | INTRAVENOUS | Status: DC

## 2018-02-03 NOTE — Progress Notes (Signed)
Report given to PACU, vss 

## 2018-02-03 NOTE — Patient Instructions (Signed)
YOU HAD AN ENDOSCOPIC PROCEDURE TODAY AT THE [facility]:   Refer to the procedure report that was given to you for any specific questions about what was found during the examination.  If the procedure report does not answer your questions, please call your gastroenterologist to clarify.  If you requested that your care partner not be given the details of your procedure findings, then the procedure report has been included in a sealed envelope for you to review at your convenience later.  YOU SHOULD EXPECT: Some feelings of bloating in the abdomen. Passage of more gas than usual.  Walking can help get rid of the air that was put into your GI tract during the procedure and reduce the bloating. If you had a lower endoscopy (such as a colonoscopy or flexible sigmoidoscopy) you may notice spotting of blood in your stool or on the toilet paper. If you underwent a bowel prep for your procedure, you may not have a normal bowel movement for a few days.  Please Note:  You might notice some irritation and congestion in your nose or some drainage.  This is from the oxygen used during your procedure.  There is no need for concern and it should clear up in a day or so.  SYMPTOMS TO REPORT IMMEDIATELY:   Following lower endoscopy (colonoscopy or flexible sigmoidoscopy):  Excessive amounts of blood in the stool  Significant tenderness or worsening of abdominal pains  Swelling of the abdomen that is new, acute  Fever of 100F or higher  For urgent or emergent issues, a gastroenterologist can be reached at any hour by calling [phone].   DIET:  We do recommend a small meal at first, but then you may proceed to your regular diet.  Drink plenty of fluids but you should avoid alcoholic beverages for 24 hours.  ACTIVITY:  You should plan to take it easy for the rest of today and you should NOT DRIVE or use heavy machinery until tomorrow (because of the sedation medicines used during the test).     FOLLOW UP: Our staff will call the number listed on your records the next business day following your procedure to check on you and address any questions or concerns that you may have regarding the information given to you following your procedure. If we do not reach you, we will leave a message.  However, if you are feeling well and you are not experiencing any problems, there is no need to return our call.  We will assume that you have returned to your regular daily activities without incident.  If any biopsies were taken you will be contacted by phone or by letter within the next 1-3 weeks.  Please call us at [phone] if you have not heard about the biopsies in 3 weeks.    SIGNATURES/CONFIDENTIALITY: You and/or your care partner have signed paperwork which will be entered into your electronic medical record.  These signatures attest to the fact that that the information above on your After Visit Summary has been reviewed and is understood.  Full responsibility of the confidentiality of this discharge information lies with you and/or your care-partner. 

## 2018-02-03 NOTE — Op Note (Addendum)
[facility] Patient Name: Casey Doyle Procedure Date: 02/03/2018 1:25 PM MRN: [id number] Endoscopist: Thornton Park MD, MD Age: 72 Referring MD:  Date of Birth: 07-30-1945 Gender: Female Account #: [id number] Procedure:                Colonoscopy Indications:              Screening for colorectal malignant neoplasm. Normal                            screening colonsocopy 2008 with Dr. Olevia Perches. No                            known family history of colon cancer or polyps. No                            baseline GI symptoms. Medicines:                See the Anesthesia note for documentation of the                            administered medications Procedure:                Pre-Anesthesia Assessment:                           - Prior to the procedure, a History and Physical                            was performed, and patient medications and                            allergies were reviewed. The patient's tolerance of                            previous anesthesia was also reviewed. The risks                            and benefits of the procedure and the sedation                            options and risks were discussed with the patient.                            All questions were answered, and informed consent                            was obtained. Prior Anticoagulants: The patient has                            taken no previous anticoagulant or antiplatelet                            agents. ASA Grade Assessment: II - A patient with  mild systemic disease. After reviewing the risks                            and benefits, the patient was deemed in                            satisfactory condition to undergo the procedure.                           After obtaining informed consent, the colonoscope                            was passed under direct vision. Throughout the                            procedure, the patient's blood  pressure, pulse, and                            oxygen saturations were monitored continuously. The                            Colonoscope was introduced through the anus and                            advanced to the the sigmoid colon. The colonoscopy                            was performed without difficulty. The patient                            tolerated the procedure well. The quality of the                            bowel preparation was inadequate. Scope In: 1:40:22 PM Scope Out: 1:45:41 PM Total Procedure Duration: 0 hours 5 minutes 19 seconds  Findings:                 The perianal and digital rectal examinations were                            normal.                           Many small and large-mouthed diverticula were found                            in the sigmoid colon.                           A large amount of liquid semi-liquid semi-solid                            solid stool was found in the entire colon,  precluding visualization. Complications:            No immediate complications. Estimated Blood Loss:     Estimated blood loss: none. Impression:               - Preparation of the colon was inadequate for any                            meaningful evaluation.                           - Extensive diverticulosis in the sigmoid colon.                           - Stool in the entire examined colon.                           - No specimens collected. Recommendation:           - Discharge patient to home.                           - Resume regular diet. High fiber diet recommended.                           - Continue present medications.                           - Repeat colonoscopy at the next available                            appointment because the bowel preparation was poor.                            Two day bowel prep recommended. Thornton Park MD, MD 02/03/2018 1:52:02 PM This report has been signed electronically.

## 2018-02-03 NOTE — Progress Notes (Signed)
Pt's states no medical or surgical changes since previsit or office visit. 

## 2018-02-04 ENCOUNTER — Telehealth: Payer: Self-pay

## 2018-02-04 NOTE — Telephone Encounter (Signed)
  Follow up Call-  Call back number 02/03/2018  Post procedure Call Back phone  # [phone]  Permission to leave phone message Yes  Some recent data might be hidden     Patient questions:  Do you have a fever, pain , or abdominal swelling? No. Pain Score  0 *  Have you tolerated food without any problems? Yes.    Have you been able to return to your normal activities? Yes.    Do you have any questions about your discharge instructions: Diet   No. Medications  No. Follow up visit  No.  Do you have questions or concerns about your Care? No.  Actions: * If pain score is 4 or above: No action needed, pain <4.  Correct pt. Contact no. Is [phone].

## 2018-02-16 ENCOUNTER — Other Ambulatory Visit: Payer: Self-pay

## 2018-02-16 ENCOUNTER — Ambulatory Visit (AMBULATORY_SURGERY_CENTER): Payer: Self-pay | Admitting: *Deleted

## 2018-02-16 VITALS — Ht 62.0 in | Wt 133.2 lb

## 2018-02-16 DIAGNOSIS — Z1211 Encounter for screening for malignant neoplasm of colon: Secondary | ICD-10-CM

## 2018-02-16 MED ORDER — SUPREP BOWEL PREP KIT 17.5-3.13-1.6 GM/177ML PO SOLN: 1.0000 | Freq: Once | ORAL | 0 refills | Status: AC

## 2018-02-16 NOTE — Progress Notes (Signed)
No egg or soy allergy known to patient  No issues with past sedation with any surgeries  or procedures, no intubation problems  No diet pills per patient No home 02 use per patient  No blood thinners per patient  Pt denies issues with constipation  No A fib or A flutter  EMMI video offered and declined by the patient. 

## 2018-03-14 DIAGNOSIS — I10 Essential (primary) hypertension: Secondary | ICD-10-CM | POA: Diagnosis not present

## 2018-03-14 DIAGNOSIS — Z0000 Encounter for general adult medical examination without abnormal findings: Secondary | ICD-10-CM | POA: Diagnosis not present

## 2018-03-14 DIAGNOSIS — E7800 Pure hypercholesterolemia, unspecified: Secondary | ICD-10-CM | POA: Diagnosis not present

## 2018-03-14 DIAGNOSIS — E039 Hypothyroidism, unspecified: Secondary | ICD-10-CM | POA: Diagnosis not present

## 2018-03-24 ENCOUNTER — Encounter: Payer: Medicare Other | Admitting: Gastroenterology

## 2018-04-04 ENCOUNTER — Encounter: Payer: Self-pay | Admitting: Gastroenterology

## 2018-04-04 ENCOUNTER — Ambulatory Visit (AMBULATORY_SURGERY_CENTER): Payer: Medicare Other | Admitting: Gastroenterology

## 2018-04-04 VITALS — BP 122/73 | HR 70 | Temp 97.8°F | Resp 14 | Ht 62.0 in | Wt 133.0 lb

## 2018-04-04 DIAGNOSIS — K635 Polyp of colon: Secondary | ICD-10-CM

## 2018-04-04 DIAGNOSIS — Z1211 Encounter for screening for malignant neoplasm of colon: Secondary | ICD-10-CM | POA: Diagnosis not present

## 2018-04-04 DIAGNOSIS — D120 Benign neoplasm of cecum: Secondary | ICD-10-CM | POA: Diagnosis not present

## 2018-04-04 MED ORDER — SODIUM CHLORIDE 0.9 % IV SOLN: 500.0000 mL | Freq: Once | INTRAVENOUS | Status: AC

## 2018-04-04 NOTE — Progress Notes (Signed)
Called to room to assist during endoscopic procedure.  Patient ID and intended procedure confirmed with present staff. Received instructions for my participation in the procedure from the performing physician.  

## 2018-04-04 NOTE — Patient Instructions (Signed)
YOU HAD AN ENDOSCOPIC PROCEDURE TODAY AT [facility]:   Refer to the procedure report that was given to you for any specific questions about what was found during the examination.  If the procedure report does not answer your questions, please call your gastroenterologist to clarify.  If you requested that your care partner not be given the details of your procedure findings, then the procedure report has been included in a sealed envelope for you to review at your convenience later.  YOU SHOULD EXPECT: Some feelings of bloating in the abdomen. Passage of more gas than usual.  Walking can help get rid of the air that was put into your GI tract during the procedure and reduce the bloating. If you had a lower endoscopy (such as a colonoscopy or flexible sigmoidoscopy) you may notice spotting of blood in your stool or on the toilet paper. If you underwent a bowel prep for your procedure, you may not have a normal bowel movement for a few days.  Please Note:  You might notice some irritation and congestion in your nose or some drainage.  This is from the oxygen used during your procedure.  There is no need for concern and it should clear up in a day or so.  SYMPTOMS TO REPORT IMMEDIATELY:   Following lower endoscopy (colonoscopy or flexible sigmoidoscopy):  Excessive amounts of blood in the stool  Significant tenderness or worsening of abdominal pains  Swelling of the abdomen that is new, acute  Fever of 100F or higher  For urgent or emergent issues, a gastroenterologist can be reached at any hour by calling [phone].   DIET:  We do recommend a small meal at first, but then you may proceed to your regular diet.  Drink plenty of fluids but you should avoid alcoholic beverages for 24 hours.  ACTIVITY:  You should plan to take it easy for the rest of today and you should NOT DRIVE or use heavy machinery until tomorrow (because of the sedation medicines used during the test).     FOLLOW UP: Our staff will call the number listed on your records the next business day following your procedure to check on you and address any questions or concerns that you may have regarding the information given to you following your procedure. If we do not reach you, we will leave a message.  However, if you are feeling well and you are not experiencing any problems, there is no need to return our call.  We will assume that you have returned to your regular daily activities without incident.  If any biopsies were taken you will be contacted by phone or by letter within the next 1-3 weeks.  Please call us at [phone] if you have not heard about the biopsies in 3 weeks.   Await for biopsy results Polyps (handout given) Diverticulosis (handout given) High Fiber Diet (handout given)  SIGNATURES/CONFIDENTIALITY: You and/or your care partner have signed paperwork which will be entered into your electronic medical record.  These signatures attest to the fact that that the information above on your After Visit Summary has been reviewed and is understood.  Full responsibility of the confidentiality of this discharge information lies with you and/or your care-partner.

## 2018-04-04 NOTE — Op Note (Addendum)
[facility] Patient Name: Casey Doyle Procedure Date: 04/04/2018 8:01 AM MRN: [id number] Endoscopist: Thornton Park MD, MD Age: 73 Referring MD:  Date of Birth: 02-03-1946 Gender: Female Account #: [id number] Procedure:                Colonoscopy Indications:              Screening for colorectal malignant neoplasm. Poor                            prep on attempted colonoscopy 01/2018. No known                            family history of colon cancer or polyps. Medicines:                See the Anesthesia note for documentation of the                            administered medications Procedure:                Pre-Anesthesia Assessment:                           - Prior to the procedure, a History and Physical                            was performed, and patient medications and                            allergies were reviewed. The patient's tolerance of                            previous anesthesia was also reviewed. The risks                            and benefits of the procedure and the sedation                            options and risks were discussed with the patient.                            All questions were answered, and informed consent                            was obtained. Prior Anticoagulants: The patient has                            taken no previous anticoagulant or antiplatelet                            agents. ASA Grade Assessment: II - A patient with                            mild systemic disease. After reviewing the risks  and benefits, the patient was deemed in                            satisfactory condition to undergo the procedure.                           After obtaining informed consent, the colonoscope                            was passed under direct vision. Throughout the                            procedure, the patient's blood pressure, pulse, and                            oxygen saturations  were monitored continuously. The                            Colonoscope was introduced through the anus and                            advanced to the the terminal ileum, with                            identification of the appendiceal orifice and IC                            valve. The colonoscopy was performed without                            difficulty. The patient tolerated the procedure                            well. The quality of the bowel preparation was good. Scope In: 8:05:48 AM Scope Out: 8:21:08 AM Scope Withdrawal Time: 0 hours 12 minutes 9 seconds  Total Procedure Duration: 0 hours 15 minutes 20 seconds  Findings:                 Hemorrhoids were found on perianal exam.                           Multiple small and large-mouthed diverticula were                            found in the sigmoid colon, descending colon,                            transverse colon and ascending colon.                           A 2 mm polyp was found in the cecum. The polyp was                            sessile. The polyp was removed with a cold  snare.                            Resection and retrieval were complete. Estimated                            blood loss was minimal.                           The exam was otherwise without abnormality on                            direct and retroflexion views. Complications:            No immediate complications. Estimated blood loss:                            Minimal. Estimated Blood Loss:     Estimated blood loss was minimal. Impression:               - Hemorrhoids found on perianal exam.                           - Diverticulosis in the sigmoid colon, in the                            descending colon, in the transverse colon and in                            the ascending colon.                           - One 2 mm polyp in the cecum, removed with a cold                            snare. Resected and retrieved.                           -  The examination was otherwise normal on direct                            and retroflexion views. Recommendation:           - Discharge patient to home.                           - Resume previous diet. High fiber diet recommended.                           - Continue present medications.                           - Await pathology results.                           - No surveillance colonoscopy recommended given her  age. Thornton Park MD, MD 04/04/2018 8:29:28 AM This report has been signed electronically.

## 2018-04-04 NOTE — Progress Notes (Signed)
PT taken to PACU. Monitors in place. VSS. Report given to RN. 

## 2018-04-05 ENCOUNTER — Telehealth: Payer: Self-pay | Admitting: *Deleted

## 2018-04-05 NOTE — Telephone Encounter (Signed)
  Follow up Call-  Call back number 04/04/2018 02/03/2018  Post procedure Call Back phone  # [phone]  Permission to leave phone message Yes Yes  Some recent data might be hidden     Patient questions:  Do you have a fever, pain , or abdominal swelling? No. Pain Score  0 *  Have you tolerated food without any problems? Yes.    Have you been able to return to your normal activities? Yes.    Do you have any questions about your discharge instructions: Diet   No. Medications  No. Follow up visit  No.  Do you have questions or concerns about your Care? No.  Actions: * If pain score is 4 or above: No action needed, pain <4.

## 2018-04-11 ENCOUNTER — Encounter: Payer: Self-pay | Admitting: Gastroenterology

## 2018-04-27 DIAGNOSIS — Z96651 Presence of right artificial knee joint: Secondary | ICD-10-CM | POA: Diagnosis not present

## 2018-04-27 DIAGNOSIS — M810 Age-related osteoporosis without current pathological fracture: Secondary | ICD-10-CM | POA: Diagnosis not present

## 2018-04-27 DIAGNOSIS — Z90710 Acquired absence of both cervix and uterus: Secondary | ICD-10-CM | POA: Diagnosis not present

## 2018-04-27 DIAGNOSIS — Z8262 Family history of osteoporosis: Secondary | ICD-10-CM | POA: Diagnosis not present

## 2018-08-19 DIAGNOSIS — F411 Generalized anxiety disorder: Secondary | ICD-10-CM | POA: Diagnosis not present

## 2018-08-19 DIAGNOSIS — M810 Age-related osteoporosis without current pathological fracture: Secondary | ICD-10-CM | POA: Diagnosis not present

## 2018-08-19 DIAGNOSIS — E7800 Pure hypercholesterolemia, unspecified: Secondary | ICD-10-CM | POA: Diagnosis not present

## 2018-08-19 DIAGNOSIS — E039 Hypothyroidism, unspecified: Secondary | ICD-10-CM | POA: Diagnosis not present

## 2018-08-25 DIAGNOSIS — E039 Hypothyroidism, unspecified: Secondary | ICD-10-CM | POA: Diagnosis not present

## 2018-08-25 DIAGNOSIS — E7800 Pure hypercholesterolemia, unspecified: Secondary | ICD-10-CM | POA: Diagnosis not present

## 2018-11-28 DIAGNOSIS — L237 Allergic contact dermatitis due to plants, except food: Secondary | ICD-10-CM | POA: Diagnosis not present

## 2018-11-30 DIAGNOSIS — G4700 Insomnia, unspecified: Secondary | ICD-10-CM | POA: Diagnosis not present

## 2018-11-30 DIAGNOSIS — F411 Generalized anxiety disorder: Secondary | ICD-10-CM | POA: Diagnosis not present

## 2018-11-30 DIAGNOSIS — M810 Age-related osteoporosis without current pathological fracture: Secondary | ICD-10-CM | POA: Diagnosis not present

## 2018-12-29 DIAGNOSIS — R29890 Loss of height: Secondary | ICD-10-CM | POA: Diagnosis not present

## 2018-12-29 DIAGNOSIS — M810 Age-related osteoporosis without current pathological fracture: Secondary | ICD-10-CM | POA: Diagnosis not present

## 2018-12-29 DIAGNOSIS — M545 Low back pain: Secondary | ICD-10-CM | POA: Diagnosis not present

## 2018-12-29 DIAGNOSIS — Z8781 Personal history of (healed) traumatic fracture: Secondary | ICD-10-CM | POA: Diagnosis not present

## 2019-03-01 DIAGNOSIS — E559 Vitamin D deficiency, unspecified: Secondary | ICD-10-CM | POA: Diagnosis not present

## 2019-03-01 DIAGNOSIS — Z0000 Encounter for general adult medical examination without abnormal findings: Secondary | ICD-10-CM | POA: Diagnosis not present

## 2019-03-01 DIAGNOSIS — I10 Essential (primary) hypertension: Secondary | ICD-10-CM | POA: Diagnosis not present

## 2019-03-01 DIAGNOSIS — E039 Hypothyroidism, unspecified: Secondary | ICD-10-CM | POA: Diagnosis not present

## 2019-04-08 ENCOUNTER — Ambulatory Visit: Payer: Medicare Other | Attending: Internal Medicine

## 2019-04-08 DIAGNOSIS — Z23 Encounter for immunization: Secondary | ICD-10-CM | POA: Insufficient documentation

## 2019-04-08 NOTE — Progress Notes (Signed)
   Covid-19 Vaccination Clinic  Name:  Casey Doyle    MRN: [id number] DOB: November 25, 1945  04/08/2019  [name] was observed post Covid-19 immunization for 15 minutes without incidence. She was provided with Vaccine Information Sheet and instruction to access the V-Safe system.   [name] was instructed to call 911 with any severe reactions post vaccine: Marland Kitchen Difficulty breathing  . Swelling of your face and throat  . A fast heartbeat  . A bad rash all over your body  . Dizziness and weakness    Immunizations Administered    Name Date Dose VIS Date Route   Pfizer COVID-19 Vaccine 04/08/2019 11:58 AM 0.3 mL 02/24/2019 Intramuscular   Manufacturer: [place]   Lot: [id number]   [place]: [id number]

## 2019-04-21 DIAGNOSIS — H6123 Impacted cerumen, bilateral: Secondary | ICD-10-CM | POA: Diagnosis not present

## 2019-04-29 ENCOUNTER — Ambulatory Visit: Payer: Medicare Other | Attending: Internal Medicine

## 2019-04-29 DIAGNOSIS — Z23 Encounter for immunization: Secondary | ICD-10-CM

## 2019-04-29 NOTE — Progress Notes (Signed)
   Covid-19 Vaccination Clinic  Name:  Casey Doyle    MRN: [id number] DOB: [birth year]  04/29/2019  [name] was observed post Covid-19 immunization for 30 minutes based on pre-vaccination screening without incidence. She was provided with Vaccine Information Sheet and instruction to access the V-Safe system.   [name] was instructed to call 911 with any severe reactions post vaccine: Marland Kitchen Difficulty breathing  . Swelling of your face and throat  . A fast heartbeat  . A bad rash all over your body  . Dizziness and weakness    Immunizations Administered    Name Date Dose VIS Date Route   Pfizer COVID-19 Vaccine 04/29/2019 11:45 AM 0.3 mL 02/24/2019 Intramuscular   Manufacturer: [place]   Lot: [id number]   [place]: [id number]

## 2019-05-31 DIAGNOSIS — Z1231 Encounter for screening mammogram for malignant neoplasm of breast: Secondary | ICD-10-CM | POA: Diagnosis not present

## 2019-06-01 DIAGNOSIS — E039 Hypothyroidism, unspecified: Secondary | ICD-10-CM | POA: Diagnosis not present

## 2019-09-01 DIAGNOSIS — G4700 Insomnia, unspecified: Secondary | ICD-10-CM | POA: Diagnosis not present

## 2019-09-01 DIAGNOSIS — E039 Hypothyroidism, unspecified: Secondary | ICD-10-CM | POA: Diagnosis not present

## 2019-09-01 DIAGNOSIS — E559 Vitamin D deficiency, unspecified: Secondary | ICD-10-CM | POA: Diagnosis not present

## 2019-09-01 DIAGNOSIS — I10 Essential (primary) hypertension: Secondary | ICD-10-CM | POA: Diagnosis not present

## 2020-03-07 DIAGNOSIS — E559 Vitamin D deficiency, unspecified: Secondary | ICD-10-CM | POA: Diagnosis not present

## 2020-03-07 DIAGNOSIS — E039 Hypothyroidism, unspecified: Secondary | ICD-10-CM | POA: Diagnosis not present

## 2020-03-07 DIAGNOSIS — Z0000 Encounter for general adult medical examination without abnormal findings: Secondary | ICD-10-CM | POA: Diagnosis not present

## 2020-03-07 DIAGNOSIS — I10 Essential (primary) hypertension: Secondary | ICD-10-CM | POA: Diagnosis not present

## 2020-06-05 DIAGNOSIS — Z803 Family history of malignant neoplasm of breast: Secondary | ICD-10-CM | POA: Diagnosis not present

## 2020-06-05 DIAGNOSIS — Z1231 Encounter for screening mammogram for malignant neoplasm of breast: Secondary | ICD-10-CM | POA: Diagnosis not present

## 2020-09-13 DIAGNOSIS — E039 Hypothyroidism, unspecified: Secondary | ICD-10-CM | POA: Diagnosis not present

## 2020-09-13 DIAGNOSIS — E7800 Pure hypercholesterolemia, unspecified: Secondary | ICD-10-CM | POA: Diagnosis not present

## 2020-09-13 DIAGNOSIS — G4700 Insomnia, unspecified: Secondary | ICD-10-CM | POA: Diagnosis not present

## 2020-09-13 DIAGNOSIS — I10 Essential (primary) hypertension: Secondary | ICD-10-CM | POA: Diagnosis not present

## 2021-01-28 ENCOUNTER — Ambulatory Visit: Payer: Self-pay

## 2021-01-28 ENCOUNTER — Other Ambulatory Visit: Payer: Self-pay | Admitting: Physician Assistant

## 2021-01-28 ENCOUNTER — Ambulatory Visit (INDEPENDENT_AMBULATORY_CARE_PROVIDER_SITE_OTHER): Payer: Medicare Other | Admitting: Physician Assistant

## 2021-01-28 ENCOUNTER — Other Ambulatory Visit: Payer: Self-pay

## 2021-01-28 ENCOUNTER — Encounter: Payer: Self-pay | Admitting: Physician Assistant

## 2021-01-28 DIAGNOSIS — M25512 Pain in left shoulder: Secondary | ICD-10-CM | POA: Diagnosis not present

## 2021-01-28 MED ORDER — METHYLPREDNISOLONE ACETATE 40 MG/ML IJ SUSP
40.0000 mg | INTRAMUSCULAR | Status: AC | PRN
  Administered 2021-01-28: 40 mg via INTRA_ARTICULAR

## 2021-01-28 MED ORDER — HYDROCODONE-ACETAMINOPHEN 5-325 MG PO TABS: 1.0000 | ORAL_TABLET | ORAL | 0 refills | Status: AC | PRN

## 2021-01-28 MED ORDER — LIDOCAINE HCL 1 % IJ SOLN
5.0000 mL | INTRAMUSCULAR | Status: AC | PRN
  Administered 2021-01-28: 5 mL

## 2021-01-28 NOTE — Progress Notes (Signed)
Office Visit Note   Patient: Casey Doyle           Date of Birth: [birth year]           MRN: [id number] Visit Date: 01/28/2021              Requested by: No referring provider defined for this encounter. PCP: Darcus Austin, MD (Inactive)  Chief Complaint  Patient presents with  . Left Shoulder - New Patient (Initial Visit)      HPI: Patient is a healthy 75 year old woman with a chief complaint of left shoulder pain.  She said she tripped on some rugs at home a little over a week ago.  She began having pain in her shoulder.  She denies significant swelling.  She denies any Spaete pain in her cervical spine.  She denies any paresthesias.  She focuses her pain over her anterior shoulder that sometimes runs down into her bicep.  Also posteriorly.  She has difficulty with range of motion of her shoulder secondary to pain.  Denies any previous history  Assessment & Plan: Visit Diagnoses:  1. Left shoulder pain, unspecified chronicity     Plan: Findings consistent with rotator cuff injury also proximal biceps injury.  Discussed with her the natural history of this I will call her in a few pain pills as she is having a lot of pain.  She understands to do pendulum exercises and range of motion exercises that I demonstrated to her today.  She would like to go forward with an injection into her left shoulder.  She will follow-up in 3 weeks with Dr. Sharol Given.  Follow-Up Instructions: No follow-ups on file.   Ortho Exam  Patient is alert, oriented, no adenopathy, well-dressed, normal affect, normal respiratory effort. No tenderness to palpation over the cervical spine.  She does have minimal tenderness to palpation over the shoulder more than likely at biceps insertion proximally.  She has difficulty with forward elevation and internal rotation behind her back positive impingement findings.  She does have some pain with speeds test.  She has a bruising running from the anterior shoulder down into  her arm.  She has strength is 5 out of 5 no paresthesias distally.  Imaging: No results found. No images are attached to the encounter.  Labs: No results found for: HGBA1C, ESRSEDRATE, CRP, LABURIC, REPTSTATUS, GRAMSTAIN, CULT, LABORGA   No results found for: ALBUMIN, PREALBUMIN, CBC  No results found for: MG No results found for: VD25OH  No results found for: PREALBUMIN No flowsheet data found.   There is no height or weight on file to calculate BMI.  Orders:  Orders Placed This Encounter  Procedures  . XR Shoulder Left   Meds ordered this encounter  Medications  . HYDROcodone-acetaminophen (NORCO/VICODIN) 5-325 MG tablet    Sig: Take 1 tablet by mouth every 4 (four) hours as needed for moderate pain.    Dispense:  20 tablet    Refill:  0     Procedures: Large Joint Inj on 01/28/2021 11:11 AM Indications: diagnostic evaluation and pain Details: 25 G 1.5 in needle, posterior approach  Arthrogram: No  Medications: 5 mL lidocaine 1 %; 40 mg methylPREDNISolone acetate 40 MG/ML Outcome: tolerated well, no immediate complications Procedure, treatment alternatives, risks and benefits explained, specific risks discussed. Consent was given by the patient.     Clinical Data: No additional findings.  ROS:  All other systems negative, except as noted in the HPI. Review of Systems  Objective: Vital Signs: There were no vitals taken for this visit.  Specialty Comments:  No specialty comments available.  PMFS History: There are no problems to display for this patient.  Past Medical History:  Diagnosis Date  . Allergy   . Anxiety   . Depression   . Hyperlipidemia   . Hypertension   . Osteopenia   . Thyroid disease     Family History  Problem Relation Age of Onset  . Colon cancer Neg Hx   . Esophageal cancer Neg Hx   . Rectal cancer Neg Hx   . Stomach cancer Neg Hx   . Colon polyps Neg Hx     Past Surgical History:  Procedure Laterality Date  .  ABDOMINAL HYSTERECTOMY    . THYROID SURGERY    . TOTAL HIP ARTHROPLASTY Right    Social History   Occupational History  . Not on file  Tobacco Use  . Smoking status: Former  . Smokeless tobacco: Never  . Tobacco comments:    Quit 30 years ago.   Vaping Use  . Vaping Use: Never used  Substance and Sexual Activity  . Alcohol use: Not Currently  . Drug use: Never  . Sexual activity: Not on file

## 2021-03-04 ENCOUNTER — Ambulatory Visit: Payer: Medicare Other | Admitting: Orthopedic Surgery

## 2021-06-18 DIAGNOSIS — Z780 Asymptomatic menopausal state: Secondary | ICD-10-CM | POA: Diagnosis not present

## 2021-06-18 DIAGNOSIS — M810 Age-related osteoporosis without current pathological fracture: Secondary | ICD-10-CM | POA: Diagnosis not present

## 2021-06-18 DIAGNOSIS — M8589 Other specified disorders of bone density and structure, multiple sites: Secondary | ICD-10-CM | POA: Diagnosis not present

## 2021-06-18 DIAGNOSIS — Z1231 Encounter for screening mammogram for malignant neoplasm of breast: Secondary | ICD-10-CM | POA: Diagnosis not present

## 2021-07-29 DIAGNOSIS — L650 Telogen effluvium: Secondary | ICD-10-CM | POA: Diagnosis not present

## 2021-07-29 DIAGNOSIS — L648 Other androgenic alopecia: Secondary | ICD-10-CM | POA: Diagnosis not present

## 2021-09-10 DIAGNOSIS — H25813 Combined forms of age-related cataract, bilateral: Secondary | ICD-10-CM | POA: Diagnosis not present

## 2021-09-10 DIAGNOSIS — H04123 Dry eye syndrome of bilateral lacrimal glands: Secondary | ICD-10-CM | POA: Diagnosis not present

## 2021-10-07 DIAGNOSIS — E7800 Pure hypercholesterolemia, unspecified: Secondary | ICD-10-CM | POA: Diagnosis not present

## 2021-10-07 DIAGNOSIS — E039 Hypothyroidism, unspecified: Secondary | ICD-10-CM | POA: Diagnosis not present

## 2021-10-07 DIAGNOSIS — I10 Essential (primary) hypertension: Secondary | ICD-10-CM | POA: Diagnosis not present

## 2021-10-07 DIAGNOSIS — G4700 Insomnia, unspecified: Secondary | ICD-10-CM | POA: Diagnosis not present

## 2021-12-31 DIAGNOSIS — H2513 Age-related nuclear cataract, bilateral: Secondary | ICD-10-CM | POA: Diagnosis not present

## 2022-01-29 DIAGNOSIS — L648 Other androgenic alopecia: Secondary | ICD-10-CM | POA: Diagnosis not present

## 2022-02-10 DIAGNOSIS — H25812 Combined forms of age-related cataract, left eye: Secondary | ICD-10-CM | POA: Diagnosis not present

## 2022-02-11 DIAGNOSIS — Z48810 Encounter for surgical aftercare following surgery on the sense organs: Secondary | ICD-10-CM | POA: Diagnosis not present

## 2022-02-11 DIAGNOSIS — H2511 Age-related nuclear cataract, right eye: Secondary | ICD-10-CM | POA: Diagnosis not present

## 2022-02-11 DIAGNOSIS — H40003 Preglaucoma, unspecified, bilateral: Secondary | ICD-10-CM | POA: Diagnosis not present

## 2022-02-26 DIAGNOSIS — H25811 Combined forms of age-related cataract, right eye: Secondary | ICD-10-CM | POA: Diagnosis not present

## 2022-02-27 DIAGNOSIS — H40003 Preglaucoma, unspecified, bilateral: Secondary | ICD-10-CM | POA: Diagnosis not present

## 2022-02-27 DIAGNOSIS — Z48810 Encounter for surgical aftercare following surgery on the sense organs: Secondary | ICD-10-CM | POA: Diagnosis not present

## 2022-02-27 DIAGNOSIS — H26492 Other secondary cataract, left eye: Secondary | ICD-10-CM | POA: Diagnosis not present

## 2022-02-27 DIAGNOSIS — Z961 Presence of intraocular lens: Secondary | ICD-10-CM | POA: Diagnosis not present

## 2022-04-15 DIAGNOSIS — E7800 Pure hypercholesterolemia, unspecified: Secondary | ICD-10-CM | POA: Diagnosis not present

## 2022-04-15 DIAGNOSIS — N1831 Chronic kidney disease, stage 3a: Secondary | ICD-10-CM | POA: Diagnosis not present

## 2022-04-15 DIAGNOSIS — E559 Vitamin D deficiency, unspecified: Secondary | ICD-10-CM | POA: Diagnosis not present

## 2022-04-15 DIAGNOSIS — G4700 Insomnia, unspecified: Secondary | ICD-10-CM | POA: Diagnosis not present

## 2022-04-15 DIAGNOSIS — F411 Generalized anxiety disorder: Secondary | ICD-10-CM | POA: Diagnosis not present

## 2022-04-15 DIAGNOSIS — Z0000 Encounter for general adult medical examination without abnormal findings: Secondary | ICD-10-CM | POA: Diagnosis not present

## 2022-04-15 DIAGNOSIS — I10 Essential (primary) hypertension: Secondary | ICD-10-CM | POA: Diagnosis not present

## 2022-04-15 DIAGNOSIS — E039 Hypothyroidism, unspecified: Secondary | ICD-10-CM | POA: Diagnosis not present

## 2022-04-15 DIAGNOSIS — R202 Paresthesia of skin: Secondary | ICD-10-CM | POA: Diagnosis not present

## 2022-04-15 DIAGNOSIS — M810 Age-related osteoporosis without current pathological fracture: Secondary | ICD-10-CM | POA: Diagnosis not present

## 2022-06-10 DIAGNOSIS — H40013 Open angle with borderline findings, low risk, bilateral: Secondary | ICD-10-CM | POA: Diagnosis not present

## 2022-06-15 DIAGNOSIS — H40013 Open angle with borderline findings, low risk, bilateral: Secondary | ICD-10-CM | POA: Diagnosis not present

## 2022-07-06 DIAGNOSIS — Z961 Presence of intraocular lens: Secondary | ICD-10-CM | POA: Diagnosis not present

## 2022-07-06 DIAGNOSIS — H26492 Other secondary cataract, left eye: Secondary | ICD-10-CM | POA: Diagnosis not present

## 2022-07-07 DIAGNOSIS — Z1231 Encounter for screening mammogram for malignant neoplasm of breast: Secondary | ICD-10-CM | POA: Diagnosis not present

## 2022-09-07 DIAGNOSIS — H26491 Other secondary cataract, right eye: Secondary | ICD-10-CM | POA: Diagnosis not present

## 2022-10-20 DIAGNOSIS — I10 Essential (primary) hypertension: Secondary | ICD-10-CM | POA: Diagnosis not present

## 2022-10-20 DIAGNOSIS — E039 Hypothyroidism, unspecified: Secondary | ICD-10-CM | POA: Diagnosis not present

## 2022-10-20 DIAGNOSIS — F411 Generalized anxiety disorder: Secondary | ICD-10-CM | POA: Diagnosis not present

## 2022-10-20 DIAGNOSIS — N1831 Chronic kidney disease, stage 3a: Secondary | ICD-10-CM | POA: Diagnosis not present

## 2022-10-20 DIAGNOSIS — R202 Paresthesia of skin: Secondary | ICD-10-CM | POA: Diagnosis not present

## 2022-10-20 DIAGNOSIS — G4700 Insomnia, unspecified: Secondary | ICD-10-CM | POA: Diagnosis not present

## 2022-10-20 DIAGNOSIS — B351 Tinea unguium: Secondary | ICD-10-CM | POA: Diagnosis not present

## 2022-10-20 DIAGNOSIS — E559 Vitamin D deficiency, unspecified: Secondary | ICD-10-CM | POA: Diagnosis not present

## 2022-10-20 DIAGNOSIS — E7800 Pure hypercholesterolemia, unspecified: Secondary | ICD-10-CM | POA: Diagnosis not present

## 2022-10-26 ENCOUNTER — Encounter: Payer: Self-pay | Admitting: Neurology

## 2022-11-24 ENCOUNTER — Other Ambulatory Visit (INDEPENDENT_AMBULATORY_CARE_PROVIDER_SITE_OTHER): Payer: Medicare PPO

## 2022-11-24 ENCOUNTER — Encounter: Payer: Self-pay | Admitting: Neurology

## 2022-11-24 ENCOUNTER — Ambulatory Visit: Payer: Medicare PPO | Admitting: Neurology

## 2022-11-24 VITALS — BP 142/73 | HR 85 | Ht 62.0 in | Wt 136.0 lb

## 2022-11-24 DIAGNOSIS — G629 Polyneuropathy, unspecified: Secondary | ICD-10-CM

## 2022-11-24 LAB — C-REACTIVE PROTEIN: CRP: <1 mg/dL (ref 0.5–20.0)

## 2022-11-24 LAB — SEDIMENTATION RATE: Sed Rate: 27 mm/h (ref 0–30)

## 2022-11-24 LAB — B12 AND FOLATE PANEL
Folate: 8.6 ng/mL (ref >5.9)
Vitamin B-12: 332 pg/mL (ref 211–911)

## 2022-11-24 NOTE — Progress Notes (Signed)
[facility]rology [facility] Clinic Note - Initial Visit   Date: 11/24/2022   Casey Doyle MRN: [id number] DOB: Mar 25, 1945   Dear Dr. Chanetta Marshall:  Thank you for your kind referral of Casey Doyle for consultation of feet numbness. Although her history is well known to you, please allow [place] to reiterate it for the purpose of our medical record. The patient was accompanied to the clinic by self.    Casey Doyle is a 77 y.o. right-handed female with hypertension, hyperlipidemia, anxiety, and insomnia presenting for evaluation of bilateral feet numbness.   IMPRESSION/PLAN: Bilateral feet numbness.  Exam shows reduced ankle jerks with preserved distal strength and sensation; however based on his history, idiopathic sensory neuropathy is most likely. I had extensive discussion with the patient regarding the pathogenesis, etiology, management, and natural course of neuropathy. Neuropathy tends to be slowly progressive, especially if a treatable etiology is not identified.  I would like to test for treatable causes of neuropathy. I discussed that in the vast majority of cases, despite checking for reversible causes, we are unable to find the underlying etiology and management is symptomatic.    - ESR, CRP, vitamin B12, folate, copper, SPEP with IFE  - NCS/EMG legs deferred at this time, will consider if symptoms evolve in an atypical pattern  - She does not have painful paresthesias or sensory ataxia so there is no role for medication or PT at this time  Return to clinic in 6 months  ------------------------------------------------------------- History of present illness: In 2023, she began having numbness involving the soles of both feet, which had gradually progressed into the ankles.  It makes her feel like feet are like sandpaper.  She has noticed that with sneakers, she is not aware of the symptoms as much, but with sandals, it is more noticeable.  She denies tingling or  pain.  No weakness or imbalance.    No diabetes, alcohol use, chemotherapy, or family history.  She lives at home with husband in a 3-level home.  She is retired Teacher, early years/pre.  She has two grown children.  She is active and works with a trainer twice per week.    Past Medical History:  Diagnosis Date   Allergy    Anxiety    Depression    Hyperlipidemia    Hypertension    Osteopenia    Thyroid disease     Past Surgical History:  Procedure Laterality Date   ABDOMINAL HYSTERECTOMY     THYROID SURGERY     TOTAL HIP ARTHROPLASTY Right      Medications:  Outpatient Encounter Medications as of 11/24/2022  Medication Sig   hydrochlorothiazide (MICROZIDE) 12.5 MG capsule Take 12.5 mg by mouth daily.   levothyroxine (SYNTHROID, LEVOTHROID) 75 MCG tablet Take 75 mcg by mouth daily before breakfast.   LORazepam (ATIVAN) 0.5 MG tablet Take 0.5 mg by mouth as needed for anxiety.   Multiple Vitamin (MULTIVITAMIN) tablet Take 1 tablet by mouth daily.   OVER THE COUNTER MEDICATION Vitamin D 84132 units One capsule once a week for 3 weeks, off on the 4th week.   pravastatin (PRAVACHOL) 20 MG tablet Take 20 mg by mouth daily.   HYDROcodone-acetaminophen (NORCO/VICODIN) 5-325 MG tablet Take 1 tablet by mouth every 4 (four) hours as needed for moderate pain. (Patient not taking: Reported on 11/24/2022)   zolpidem (AMBIEN) 5 MG tablet Take 5 mg by mouth at bedtime as needed for sleep.   Facility-Administered Encounter Medications as of 11/24/2022  Medication   0.9 %  sodium chloride infusion    Allergies:  Allergies  Allergen Reactions   Erythromycin Hives   Percocet [Oxycodone-Acetaminophen] Nausea And Vomiting   Prednisone     reddness of face    Family History: Family History  Problem Relation Age of Onset   Hypertension Mother    Stroke Mother    Heart attack Father    Colon cancer Neg Hx    Esophageal cancer Neg Hx    Rectal cancer Neg Hx    Stomach cancer Neg Hx     Colon polyps Neg Hx     Social History: Social History   Tobacco Use   Smoking status: Former   Smokeless tobacco: Never   Tobacco comments:    Quit 30 years ago.   Vaping Use   Vaping status: Never Used  Substance Use Topics   Alcohol use: Not Currently   Drug use: Never   Social History   Social History Narrative   Are you right handed or left handed? Right Handed    Are you currently employed ? No    What is your current occupation? Retired    Do you live at home alone? No    Who lives with you? Husband    What type of home do you live in: 1 story or 2 story? Lives in a three story home        Vital Signs:  BP (!) 142/73   Pulse 85   Ht 5\' 2"  (1.575 m)   Wt 136 lb (61.7 kg)   SpO2 99%   BMI 24.87 kg/m   Neurological Exam: MENTAL STATUS including orientation to time, place, person, recent and remote memory, attention span and concentration, language, and fund of knowledge is normal.  Speech is not dysarthric.  CRANIAL NERVES: II:  No visual field defects.     III-IV-VI: Pupils equal round and reactive to light.  Normal conjugate, extra-ocular eye movements in all directions of gaze.  No nystagmus.  No ptosis.   V:  Normal facial sensation.    VII:  Normal facial symmetry and movements.   VIII:  Normal hearing and vestibular function.   IX-X:  Normal palatal movement.   XI:  Normal shoulder shrug and head rotation.   XII:  Normal tongue strength and range of motion, no deviation or fasciculation.  MOTOR:  No atrophy, fasciculations or abnormal movements.  No pronator drift.   Upper Extremity:  Right  Left  Deltoid  5/5   5/5   Biceps  5/5   5/5   Triceps  5/5   5/5   Wrist extensors  5/5   5/5   Wrist flexors  5/5   5/5   Finger extensors  5/5   5/5   Finger flexors  5/5   5/5   Dorsal interossei  5/5   5/5   Abductor pollicis  5/5   5/5   Tone (Ashworth scale)  0  0   Lower Extremity:  Right  Left  Hip flexors  5/5   5/5   Hip extensors  5/5   5/5    Knee flexors  5/5   5/5   Knee extensors  5/5   5/5   Dorsiflexors  5/5   5/5   Plantarflexors  5/5   5/5   Toe extensors  5/5   5/5   Toe flexors  5/5   5/5   Tone (Ashworth scale)  0  0   MSRs:                                           Right        Left brachioradialis 2+  2+  biceps 2+  2+  triceps 2+  2+  patellar 2+  2+  ankle jerk 0  0  Hoffman no  no  plantar response down  down   SENSORY:  Normal and symmetric perception of light touch, pinprick, vibration, and proprioception.  Romberg's sign absent.   COORDINATION/GAIT: Normal finger-to- nose-finger.  Intact rapid alternating movements bilaterally.  Able to rise from a chair without using arms.  Gait narrow based and stable. Tandem and stressed gait intact.     Thank you for allowing me to participate in patient's care.  If I can answer any additional questions, I would be pleased to do so.    Sincerely,    Emmakate Hypes K. Allena Katz, DO

## 2022-11-24 NOTE — Patient Instructions (Addendum)
We will order labs to check for various causes for neuropathy.    Return to clinic in 6 months

## 2022-12-03 LAB — COPPER, SERUM: Copper: 139 ug/dL (ref 70–175)

## 2022-12-03 LAB — IMMUNOFIXATION ELECTROPHORESIS
IgG (Immunoglobin G), Serum: 1331 mg/dL (ref 600–1540)
IgM, Serum: 163 mg/dL (ref 50–300)
Immunoglobulin A: 161 mg/dL (ref 70–320)

## 2022-12-03 LAB — PROTEIN ELECTROPHORESIS, SERUM
Albumin ELP: 4.1 g/dL (ref 3.8–4.8)
Alpha 1: 0.3 g/dL (ref 0.2–0.3)
Alpha 2: 0.7 g/dL (ref 0.5–0.9)
Beta 2: 0.4 g/dL (ref 0.2–0.5)
Beta Globulin: 0.5 g/dL (ref 0.4–0.6)
Gamma Globulin: 1.1 g/dL (ref 0.8–1.7)
Total Protein: 7 g/dL (ref 6.1–8.1)

## 2023-05-31 ENCOUNTER — Encounter: Payer: Self-pay | Admitting: Neurology

## 2023-06-01 ENCOUNTER — Ambulatory Visit: Payer: Medicare PPO | Admitting: Neurology

## 2023-06-07 DIAGNOSIS — N1831 Chronic kidney disease, stage 3a: Secondary | ICD-10-CM | POA: Diagnosis not present

## 2023-06-07 DIAGNOSIS — I10 Essential (primary) hypertension: Secondary | ICD-10-CM | POA: Diagnosis not present

## 2023-06-07 DIAGNOSIS — E7800 Pure hypercholesterolemia, unspecified: Secondary | ICD-10-CM | POA: Diagnosis not present

## 2023-06-07 DIAGNOSIS — G4700 Insomnia, unspecified: Secondary | ICD-10-CM | POA: Diagnosis not present

## 2023-06-07 DIAGNOSIS — R202 Paresthesia of skin: Secondary | ICD-10-CM | POA: Diagnosis not present

## 2023-06-07 DIAGNOSIS — E039 Hypothyroidism, unspecified: Secondary | ICD-10-CM | POA: Diagnosis not present

## 2023-06-07 DIAGNOSIS — M810 Age-related osteoporosis without current pathological fracture: Secondary | ICD-10-CM | POA: Diagnosis not present

## 2023-06-09 DIAGNOSIS — E039 Hypothyroidism, unspecified: Secondary | ICD-10-CM | POA: Diagnosis not present

## 2023-06-09 DIAGNOSIS — E7800 Pure hypercholesterolemia, unspecified: Secondary | ICD-10-CM | POA: Diagnosis not present

## 2023-06-09 DIAGNOSIS — I10 Essential (primary) hypertension: Secondary | ICD-10-CM | POA: Diagnosis not present

## 2023-07-13 DIAGNOSIS — M8588 Other specified disorders of bone density and structure, other site: Secondary | ICD-10-CM | POA: Diagnosis not present

## 2023-07-13 DIAGNOSIS — Z1231 Encounter for screening mammogram for malignant neoplasm of breast: Secondary | ICD-10-CM | POA: Diagnosis not present

## 2023-07-13 DIAGNOSIS — N958 Other specified menopausal and perimenopausal disorders: Secondary | ICD-10-CM | POA: Diagnosis not present

## 2023-08-10 DIAGNOSIS — N1831 Chronic kidney disease, stage 3a: Secondary | ICD-10-CM | POA: Diagnosis not present

## 2023-08-10 DIAGNOSIS — I10 Essential (primary) hypertension: Secondary | ICD-10-CM | POA: Diagnosis not present

## 2023-08-10 DIAGNOSIS — G4700 Insomnia, unspecified: Secondary | ICD-10-CM | POA: Diagnosis not present

## 2023-08-10 DIAGNOSIS — L659 Nonscarring hair loss, unspecified: Secondary | ICD-10-CM | POA: Diagnosis not present

## 2023-08-10 DIAGNOSIS — F411 Generalized anxiety disorder: Secondary | ICD-10-CM | POA: Diagnosis not present

## 2023-08-10 DIAGNOSIS — E039 Hypothyroidism, unspecified: Secondary | ICD-10-CM | POA: Diagnosis not present

## 2023-10-26 DIAGNOSIS — M26609 Unspecified temporomandibular joint disorder, unspecified side: Secondary | ICD-10-CM | POA: Diagnosis not present

## 2023-10-26 DIAGNOSIS — H6090 Unspecified otitis externa, unspecified ear: Secondary | ICD-10-CM | POA: Diagnosis not present

## 2023-10-26 DIAGNOSIS — J301 Allergic rhinitis due to pollen: Secondary | ICD-10-CM | POA: Diagnosis not present

## 2023-11-11 DIAGNOSIS — L820 Inflamed seborrheic keratosis: Secondary | ICD-10-CM | POA: Diagnosis not present

## 2023-11-11 DIAGNOSIS — L821 Other seborrheic keratosis: Secondary | ICD-10-CM | POA: Diagnosis not present

## 2023-11-26 DIAGNOSIS — Z460 Encounter for fitting and adjustment of spectacles and contact lenses: Secondary | ICD-10-CM | POA: Diagnosis not present

## 2023-11-26 DIAGNOSIS — Z961 Presence of intraocular lens: Secondary | ICD-10-CM | POA: Diagnosis not present

## 2023-11-26 DIAGNOSIS — H524 Presbyopia: Secondary | ICD-10-CM | POA: Diagnosis not present

## 2023-11-29 DIAGNOSIS — E039 Hypothyroidism, unspecified: Secondary | ICD-10-CM | POA: Diagnosis not present

## 2023-11-29 DIAGNOSIS — E559 Vitamin D deficiency, unspecified: Secondary | ICD-10-CM | POA: Diagnosis not present

## 2023-11-29 DIAGNOSIS — N1831 Chronic kidney disease, stage 3a: Secondary | ICD-10-CM | POA: Diagnosis not present

## 2023-11-29 DIAGNOSIS — Z23 Encounter for immunization: Secondary | ICD-10-CM | POA: Diagnosis not present

## 2023-11-29 DIAGNOSIS — I10 Essential (primary) hypertension: Secondary | ICD-10-CM | POA: Diagnosis not present

## 2023-11-29 DIAGNOSIS — E7800 Pure hypercholesterolemia, unspecified: Secondary | ICD-10-CM | POA: Diagnosis not present

## 2023-11-29 DIAGNOSIS — G4700 Insomnia, unspecified: Secondary | ICD-10-CM | POA: Diagnosis not present

## 2023-11-29 DIAGNOSIS — Z0000 Encounter for general adult medical examination without abnormal findings: Secondary | ICD-10-CM | POA: Diagnosis not present

## 2023-11-29 DIAGNOSIS — F411 Generalized anxiety disorder: Secondary | ICD-10-CM | POA: Diagnosis not present

## 2023-11-29 DIAGNOSIS — Z1331 Encounter for screening for depression: Secondary | ICD-10-CM | POA: Diagnosis not present

## 2023-11-29 DIAGNOSIS — J301 Allergic rhinitis due to pollen: Secondary | ICD-10-CM | POA: Diagnosis not present

## 2023-12-10 DIAGNOSIS — M5416 Radiculopathy, lumbar region: Secondary | ICD-10-CM | POA: Diagnosis not present

## 2023-12-10 DIAGNOSIS — M79604 Pain in right leg: Secondary | ICD-10-CM | POA: Diagnosis not present

## 2023-12-22 DIAGNOSIS — M6281 Muscle weakness (generalized): Secondary | ICD-10-CM | POA: Diagnosis not present

## 2023-12-22 DIAGNOSIS — M5441 Lumbago with sciatica, right side: Secondary | ICD-10-CM | POA: Diagnosis not present

## 2023-12-22 DIAGNOSIS — M62830 Muscle spasm of back: Secondary | ICD-10-CM | POA: Diagnosis not present

## 2023-12-28 ENCOUNTER — Encounter (INDEPENDENT_AMBULATORY_CARE_PROVIDER_SITE_OTHER): Payer: Self-pay | Admitting: Otolaryngology

## 2023-12-28 ENCOUNTER — Ambulatory Visit (INDEPENDENT_AMBULATORY_CARE_PROVIDER_SITE_OTHER): Admitting: Otolaryngology

## 2023-12-28 VITALS — BP 155/84 | HR 64 | Temp 98.0°F | Ht 62.0 in | Wt 130.0 lb

## 2023-12-28 DIAGNOSIS — H6123 Impacted cerumen, bilateral: Secondary | ICD-10-CM | POA: Diagnosis not present

## 2023-12-28 DIAGNOSIS — M6281 Muscle weakness (generalized): Secondary | ICD-10-CM | POA: Diagnosis not present

## 2023-12-28 DIAGNOSIS — H9201 Otalgia, right ear: Secondary | ICD-10-CM

## 2023-12-28 DIAGNOSIS — M62830 Muscle spasm of back: Secondary | ICD-10-CM | POA: Diagnosis not present

## 2023-12-28 DIAGNOSIS — M5441 Lumbago with sciatica, right side: Secondary | ICD-10-CM | POA: Diagnosis not present

## 2023-12-28 NOTE — Progress Notes (Signed)
 CC: Right ear and jaw pain  Discussed the use of AI scribe software for clinical note transcription with the patient, who gave verbal consent to proceed.  History of Present Illness Casey Doyle is an 78 year old female who presents with right-sided jaw and ear pain. She was referred by her general physician for evaluation of ear pain.  She has been experiencing severe pain localized to the right jawline for about a month, initially suspecting a sinus infection. The pain is severe, making it difficult to distinguish whether it originates from the ear or jaw, and it is exacerbated by chewing.  She initially consulted a dentist to rule out dental issues, but no problems were identified. Her general physician noted her ears were 'clogged up' and prescribed ear drops, which provided minimal relief. Despite this treatment, discomfort persists, particularly on the right side.  She has a history of allergies and received allergy shots for eight years, which she discontinued at age 31 due to decreased effectiveness.  In her social history, she mentions experimenting with smoking about thirty years ago but does not consider herself a smoker.  The patient reports no throat pain or difficulty swallowing. Pain is primarily on the right side, with no issues on the left ear.     Past Medical History:  Diagnosis Date   Allergy    Anxiety    Depression    Hyperlipidemia    Hypertension    Osteopenia    Thyroid  disease     Past Surgical History:  Procedure Laterality Date   ABDOMINAL HYSTERECTOMY     THYROID  SURGERY     TOTAL HIP ARTHROPLASTY Right     Family History  Problem Relation Age of Onset   Hypertension Mother    Stroke Mother    Heart attack Father    Colon cancer Neg Hx    Esophageal cancer Neg Hx    Rectal cancer Neg Hx    Stomach cancer Neg Hx    Colon polyps Neg Hx     Social History:  reports that she has quit smoking. She has never used smokeless tobacco. She  reports that she does not currently use alcohol. She reports that she does not use drugs.  Allergies:  Allergies  Allergen Reactions   Erythromycin Hives   Percocet [Oxycodone-Acetaminophen ] Nausea And Vomiting   Prednisone     reddness of face    Prior to Admission medications   Medication Sig Start Date End Date Taking? Authorizing Provider  hydrochlorothiazide (MICROZIDE) 12.5 MG capsule Take 12.5 mg by mouth daily.   Yes [provider]  levothyroxine (SYNTHROID, LEVOTHROID) 75 MCG tablet Take 75 mcg by mouth daily before breakfast.   Yes [provider]  LORazepam (ATIVAN) 0.5 MG tablet Take 0.5 mg by mouth as needed for anxiety.   Yes [provider]  Multiple Vitamin (MULTIVITAMIN) tablet Take 1 tablet by mouth daily.   Yes [provider]  OVER THE COUNTER MEDICATION Vitamin D 50000 units One capsule once a week for 3 weeks, off on the 4th week.   Yes [provider]  pravastatin (PRAVACHOL) 20 MG tablet Take 20 mg by mouth daily.   Yes [provider]  HYDROcodone -acetaminophen  (NORCO/VICODIN) 5-325 MG tablet Take 1 tablet by mouth every 4 (four) hours as needed for moderate pain. Patient not taking: Reported on 11/24/2022 01/28/21   Persons, Ronal Dragon, PA  zolpidem (AMBIEN) 5 MG tablet Take 5 mg by mouth at bedtime as needed  for sleep.    [provider]    Blood pressure (!) 155/84, pulse 64, temperature 98 F (36.7 C), temperature source Oral, height 5' 2 (1.575 m), weight 130 lb (59 kg), SpO2 96%. Exam: General: Communicates without difficulty, well nourished, no acute distress. Head: Normocephalic, no evidence injury, no tenderness, facial buttresses intact without stepoff. Face/sinus: No tenderness to palpation and percussion. Facial movement is normal and symmetric. Eyes: PERRL, EOMI. No scleral icterus, conjunctivae clear. Neuro: CN II exam reveals vision grossly intact.  No nystagmus at any point of gaze.  Ears: Auricles well formed without lesions.  Bilateral cerumen impaction.  The right TMJ is tender to touch.  Nose: External evaluation reveals normal support and skin without lesions.  Dorsum is intact.  Anterior rhinoscopy reveals congested mucosa over anterior aspect of inferior turbinates and intact septum.  No purulence noted. Oral:  Oral cavity and oropharynx are intact, symmetric, without erythema or edema.  Mucosa is moist without lesions. Neck: Full range of motion without pain.  There is no significant lymphadenopathy.  No masses palpable.  Thyroid  bed within normal limits to palpation.  Parotid glands and submandibular glands equal bilaterally without mass.  Trachea is midline. Neuro:  CN 2-12 grossly intact.   Procedure: Bilateral cerumen disimpaction Anesthesia: None Description: Under the operating microscope, the cerumen is carefully removed with a combination of cerumen currette, alligator forceps, and suction catheters.  After the cerumen is removed, the TMs are noted to be normal.  No mass, erythema, or lesions. The patient tolerated the procedure well.     Assessment and Plan Assessment & Plan Right referred otalgia, secondary to temporomandibular joint (TMJ) arthralgia Right TMJ arthralgia with pain radiating to the jaw and ear, exacerbated by chewing. No evidence of ear infection or dental issues. Likely due to arthritis in the TMJ. - Take NSAIDs such as Aleve, Motrin, or ibuprofen for pain management. - Use muscle relaxant Methocarbamol (Robaxin) before bed as needed for pain relief. - Consider dental evaluation for a mouth guard if symptoms persist.  Bilateral cerumen impaction  No ear infection or abnormalities in ear canals or eardrums. - Otomicroscopy with bilateral cerumen disimpaction. - Avoid using Q-tips or inserting anything into the ear canal. - Return for professional ear cleaning if wax accumulation becomes problematic.   Casey Doyle Casey Doyle 12/28/2023, 3:06 PM

## 2023-12-29 DIAGNOSIS — M6281 Muscle weakness (generalized): Secondary | ICD-10-CM | POA: Diagnosis not present

## 2023-12-29 DIAGNOSIS — M5441 Lumbago with sciatica, right side: Secondary | ICD-10-CM | POA: Diagnosis not present

## 2023-12-29 DIAGNOSIS — M62830 Muscle spasm of back: Secondary | ICD-10-CM | POA: Diagnosis not present

## 2024-01-03 ENCOUNTER — Ambulatory Visit: Admitting: Orthopaedic Surgery

## 2024-01-03 DIAGNOSIS — M5459 Other low back pain: Secondary | ICD-10-CM | POA: Diagnosis not present

## 2024-01-03 DIAGNOSIS — M25551 Pain in right hip: Secondary | ICD-10-CM | POA: Diagnosis not present

## 2024-01-07 DIAGNOSIS — M545 Low back pain, unspecified: Secondary | ICD-10-CM | POA: Diagnosis not present

## 2024-01-11 DIAGNOSIS — M533 Sacrococcygeal disorders, not elsewhere classified: Secondary | ICD-10-CM | POA: Diagnosis not present

## 2024-01-11 DIAGNOSIS — G8310 Monoplegia of lower limb affecting unspecified side: Secondary | ICD-10-CM | POA: Diagnosis not present

## 2024-01-11 DIAGNOSIS — M48062 Spinal stenosis, lumbar region with neurogenic claudication: Secondary | ICD-10-CM | POA: Diagnosis not present

## 2024-01-11 DIAGNOSIS — M5416 Radiculopathy, lumbar region: Secondary | ICD-10-CM | POA: Diagnosis not present

## 2024-01-17 ENCOUNTER — Encounter: Payer: Self-pay | Admitting: Radiology

## 2024-01-18 DIAGNOSIS — M5416 Radiculopathy, lumbar region: Secondary | ICD-10-CM | POA: Diagnosis not present

## 2024-01-18 DIAGNOSIS — M419 Scoliosis, unspecified: Secondary | ICD-10-CM | POA: Diagnosis not present

## 2024-02-01 DIAGNOSIS — M419 Scoliosis, unspecified: Secondary | ICD-10-CM | POA: Diagnosis not present

## 2024-02-01 DIAGNOSIS — M545 Low back pain, unspecified: Secondary | ICD-10-CM | POA: Diagnosis not present

## 2024-02-01 DIAGNOSIS — M461 Sacroiliitis, not elsewhere classified: Secondary | ICD-10-CM | POA: Diagnosis not present

## 2024-02-01 DIAGNOSIS — M5416 Radiculopathy, lumbar region: Secondary | ICD-10-CM | POA: Diagnosis not present

## 2024-02-15 DIAGNOSIS — M791 Myalgia, unspecified site: Secondary | ICD-10-CM | POA: Diagnosis not present

## 2024-02-15 DIAGNOSIS — M5416 Radiculopathy, lumbar region: Secondary | ICD-10-CM | POA: Diagnosis not present

## 2024-02-15 DIAGNOSIS — M533 Sacrococcygeal disorders, not elsewhere classified: Secondary | ICD-10-CM | POA: Diagnosis not present

## 2024-02-15 DIAGNOSIS — M419 Scoliosis, unspecified: Secondary | ICD-10-CM | POA: Diagnosis not present

## 2024-02-15 DIAGNOSIS — M545 Low back pain, unspecified: Secondary | ICD-10-CM | POA: Diagnosis not present

## 2024-03-28 ENCOUNTER — Ambulatory Visit: Admitting: Dermatology

## 2024-03-28 ENCOUNTER — Encounter: Payer: Self-pay | Admitting: Dermatology

## 2024-03-28 VITALS — BP 141/73

## 2024-03-28 DIAGNOSIS — L649 Androgenic alopecia, unspecified: Secondary | ICD-10-CM | POA: Diagnosis not present

## 2024-03-28 DIAGNOSIS — L814 Other melanin hyperpigmentation: Secondary | ICD-10-CM

## 2024-03-28 MED ORDER — SAFETY SEAL MISCELLANEOUS MISC: 11 refills | Status: AC

## 2024-03-28 NOTE — Patient Instructions (Addendum)
 VISIT SUMMARY:  During your visit, we discussed your concerns about hair thinning and loss of regrowth after discontinuing your previous treatment. We also addressed your interest in treating an age spot on your face.  YOUR PLAN:  -ANDROGENETIC ALOPECIA:  Androgenetic alopecia is a common form of hair loss that occurs due to hormonal changes, often post-menopause.   To manage your hair thinning, we have prescribed a compounded treatment containing minoxidil and finasteride at the highest percentage available. You should apply the compound in the morning to avoid unwanted hair growth on the face, focusing on the temples and top of the scalp while avoiding the back of the head.   Use a Q-tip or dropper to apply a thin layer on the scalp. Additionally, we recommend using DHS Zinc medicated shampoo to extend the time between washes. Continue taking Vitrasol and consider adding collagen supplements for hair growth. We will follow up in April to assess your progress.  -SOLAR LENTIGO:  Solar lentigo, also known as an age spot, is a darkened area of skin caused by sun exposure. To lighten the spot, we have recommended Eucerin Radiant Tone a lightening cream containing thiamidol and hypericin.  Apply the cream daily and protect the area with sunscreen. We have provided samples of the lightening cream and sunscreen. We will follow up in April to evaluate the effectiveness of the treatment.  INSTRUCTIONS:  Please follow up in April to assess the progress of your hair regrowth and the effectiveness of the treatment for your age spot.           Important Information  Due to recent changes in healthcare laws, you may see results of your pathology and/or laboratory studies on MyChart before the doctors have had a chance to review them. We understand that in some cases there may be results that are confusing or concerning to you. Please understand that not all results are received at the same time and  often the doctors may need to interpret multiple results in order to provide you with the best plan of care or course of treatment. Therefore, we ask that you please give us  2 business days to thoroughly review all your results before contacting the office for clarification. Should we see a critical lab result, you will be contacted sooner.   If You Need Anything After Your Visit  If you have any questions or concerns for your doctor, please call our main line at [phone] If no one answers, please leave a voicemail as directed and we will return your call as soon as possible. Messages left after 4 pm will be answered the following business day.   You may also send us  a message via MyChart. We typically respond to MyChart messages within 1-2 business days.  For prescription refills, please ask your pharmacy to contact our office. Our fax number is [phone].  If you have an urgent issue when the clinic is closed that cannot wait until the next business day, you can page your doctor at the number below.    Please note that while we do our best to be available for urgent issues outside of office hours, we are not available 24/7.   If you have an urgent issue and are unable to reach us , you may choose to seek medical care at your doctor's office, retail clinic, urgent care center, or emergency room.  If you have a medical emergency, please immediately call 911 or go to the emergency department. In the event of  inclement weather, please call our main line at [phone] for an update on the status of any delays or closures.  Dermatology Medication Tips: Please keep the boxes that topical medications come in in order to help keep track of the instructions about where and how to use these. Pharmacies typically print the medication instructions only on the boxes and not directly on the medication tubes.   If your medication is too expensive, please contact our office at [phone] or send us   a message through MyChart.   We are unable to tell what your co-pay for medications will be in advance as this is different depending on your insurance coverage. However, we may be able to find a substitute medication at lower cost or fill out paperwork to get insurance to cover a needed medication.   If a prior authorization is required to get your medication covered by your insurance company, please allow us  1-2 business days to complete this process.  Drug prices often vary depending on where the prescription is filled and some pharmacies may offer cheaper prices.  The website www.goodrx.com contains coupons for medications through different pharmacies. The prices here do not account for what the cost may be with help from insurance (it may be cheaper with your insurance), but the website can give you the price if you did not use any insurance.  - You can print the associated coupon and take it with your prescription to the pharmacy.  - You may also stop by our office during regular business hours and pick up a GoodRx coupon card.  - If you need your prescription sent electronically to a different pharmacy, notify our office through [facility] or by phone at [phone]

## 2024-03-28 NOTE — Progress Notes (Signed)
" ° °  New Patient Visit   Subjective  Casey Doyle is a 79 y.o. female who presents for a NEW PATIENT appointment to be examined for the concerns as listed below.   Alopecia: Pt stated she previously seen a dermatologist 2 years ago with Dr. Rosalind but she has now left [facility]. Dr. Rosalind Rx a compound for hair loss in the crown made with minoxidil, finasteride and salicylic acid that she was applying once daily that worked well for her. However she is out of the topical and looking to get a refill or discuss better Tx options that may be available. She is also taking an OTC vitamin supplement.    Denied Hx of Bx.  Denied family Hx of skin cancer.   The following portions of the chart were reviewed this encounter and updated as appropriate: medications, allergies, medical history  Review of Systems:  No other skin or systemic complaints except as noted in HPI or Assessment and Plan.  Objective  Well appearing patient in no apparent distress; mood and affect are within normal limits.   A focused examination was performed of the following areas: scalp   Relevant exam findings are noted in the Assessment and Plan.               Assessment & Plan   ANDROGENETIC ALOPECIA (FEMALE PATTERN HAIR LOSS) Exam: Diffuse thinning of the crown and widening of the midline part with retention of the frontal hairline  flared  Treatment Plan: - Medrock Minoxidil 10% Finasteride 1% solution - apply to the affected areas of the scalp QAM.  - Continue taking Viviscal and incorporate Vital Protein Collagen daily - Recommended DHS Zinc shampoo for scalp oiliness    LENTIGINES Exam: scattered tan macules Due to sun exposure Treatment Plan: -Benign-appearing, observe. Recommend daily broad spectrum sunscreen SPF 30+ to sun-exposed areas, reapply every 2 hours as needed.  Call for any changes - Samples or Eucerin RT serum provided ANDROGENETIC ALOPECIA   This Visit - Safety  Seal Miscellaneous MISC - Medrock Minoxidil 10% Finasteride 1% solution - apply to the affected areas of the scalp QAM.  Return in about 3 months (around 06/26/2024) for ANDROGENETIC ALOPECIA.   Documentation: I have reviewed the above documentation for accuracy and completeness, and I agree with the above.  I, Shirron Maranda, CMA II, am acting as scribe for:   Delon Lenis, DO     "

## 2024-07-06 ENCOUNTER — Ambulatory Visit: Admitting: Dermatology
